# Patient Record
Sex: Male | Born: 1945 | Race: White | Hispanic: No | Marital: Married | Smoking: Never smoker
Health system: Southern US, Community
[De-identification: ages and names within clinical notes are randomized; demographics above are authoritative.]

## PROBLEM LIST (undated history)

## (undated) DIAGNOSIS — M109 Gout, unspecified: Secondary | ICD-10-CM

## (undated) DIAGNOSIS — I4892 Unspecified atrial flutter: Secondary | ICD-10-CM

## (undated) DIAGNOSIS — I10 Essential (primary) hypertension: Secondary | ICD-10-CM

## (undated) DIAGNOSIS — E7800 Pure hypercholesterolemia, unspecified: Secondary | ICD-10-CM

## (undated) HISTORY — DX: Unspecified atrial flutter: I48.92

## (undated) HISTORY — PX: PILONIDAL CYST EXCISION: SHX744

## (undated) HISTORY — PX: WISDOM TOOTH EXTRACTION: SHX21

## (undated) HISTORY — DX: Gout, unspecified: M10.9

## (undated) HISTORY — PX: TONSILLECTOMY: SHX5217

## (undated) HISTORY — PX: KIDNEY SURGERY: SHX687

---

## 1998-07-03 ENCOUNTER — Other Ambulatory Visit: Admission: RE | Admit: 1998-07-03 | Discharge: 1998-07-03 | Payer: Self-pay | Admitting: Cardiology

## 2003-09-12 ENCOUNTER — Emergency Department (HOSPITAL_COMMUNITY): Admission: EM | Admit: 2003-09-12 | Discharge: 2003-09-12 | Payer: Self-pay | Admitting: Emergency Medicine

## 2003-09-12 ENCOUNTER — Encounter: Payer: Self-pay | Admitting: Emergency Medicine

## 2007-01-29 ENCOUNTER — Encounter: Admission: RE | Admit: 2007-01-29 | Discharge: 2007-01-29 | Payer: Self-pay | Admitting: Otolaryngology

## 2016-06-29 ENCOUNTER — Emergency Department (HOSPITAL_COMMUNITY)
Admission: EM | Admit: 2016-06-29 | Discharge: 2016-06-29 | Disposition: A | Discharge status: Home / Self Care | Payer: BLUE CROSS/BLUE SHIELD | Attending: Emergency Medicine | Admitting: Emergency Medicine

## 2016-06-29 ENCOUNTER — Encounter (HOSPITAL_COMMUNITY): Payer: Self-pay | Admitting: *Deleted

## 2016-06-29 ENCOUNTER — Emergency Department (HOSPITAL_COMMUNITY): Payer: BLUE CROSS/BLUE SHIELD

## 2016-06-29 DIAGNOSIS — M25562 Pain in left knee: Secondary | ICD-10-CM

## 2016-06-29 DIAGNOSIS — M109 Gout, unspecified: Secondary | ICD-10-CM

## 2016-06-29 DIAGNOSIS — I10 Essential (primary) hypertension: Secondary | ICD-10-CM | POA: Insufficient documentation

## 2016-06-29 DIAGNOSIS — Z7982 Long term (current) use of aspirin: Secondary | ICD-10-CM | POA: Insufficient documentation

## 2016-06-29 DIAGNOSIS — M10062 Idiopathic gout, left knee: Secondary | ICD-10-CM | POA: Insufficient documentation

## 2016-06-29 HISTORY — DX: Pure hypercholesterolemia, unspecified: E78.00

## 2016-06-29 HISTORY — DX: Essential (primary) hypertension: I10

## 2016-06-29 LAB — BASIC METABOLIC PANEL
ANION GAP: 10 (ref 5–15)
BUN: 15 mg/dL (ref 6–20)
CHLORIDE: 100 mmol/L — AB (ref 101–111)
CO2: 24 mmol/L (ref 22–32)
Calcium: 9.3 mg/dL (ref 8.9–10.3)
Creatinine, Ser: 1.29 mg/dL — ABNORMAL HIGH (ref 0.61–1.24)
GFR calc non Af Amer: 55 mL/min — ABNORMAL LOW (ref >60)
Glucose, Bld: 116 mg/dL — ABNORMAL HIGH (ref 65–99)
Potassium: 4 mmol/L (ref 3.5–5.1)
Sodium: 134 mmol/L — ABNORMAL LOW (ref 135–145)

## 2016-06-29 LAB — CBC WITH DIFFERENTIAL/PLATELET
Basophils Absolute: 0 10*3/uL (ref 0.0–0.1)
Basophils Relative: 0 %
EOS PCT: 1 %
Eosinophils Absolute: 0.1 10*3/uL (ref 0.0–0.7)
HEMATOCRIT: 36.5 % — AB (ref 39.0–52.0)
HEMOGLOBIN: 12.5 g/dL — AB (ref 13.0–17.0)
LYMPHS PCT: 10 %
Lymphs Abs: 1.3 10*3/uL (ref 0.7–4.0)
MCH: 32.8 pg (ref 26.0–34.0)
MCHC: 34.2 g/dL (ref 30.0–36.0)
MCV: 95.8 fL (ref 78.0–100.0)
MONOS PCT: 5 %
Monocytes Absolute: 0.7 10*3/uL (ref 0.1–1.0)
NEUTROS PCT: 84 %
Neutro Abs: 10.9 10*3/uL — ABNORMAL HIGH (ref 1.7–7.7)
PLATELETS: 115 10*3/uL — AB (ref 150–400)
RBC: 3.81 MIL/uL — AB (ref 4.22–5.81)
RDW: 15.5 % (ref 11.5–15.5)
WBC: 13 10*3/uL — AB (ref 4.0–10.5)

## 2016-06-29 LAB — URIC ACID: URIC ACID, SERUM: 8.7 mg/dL — AB (ref 4.4–7.6)

## 2016-06-29 MED ORDER — HYDROCODONE-ACETAMINOPHEN 5-325 MG PO TABS: 1.0000 | ORAL_TABLET | Freq: Four times a day (QID) | ORAL | Status: DC | PRN

## 2016-06-29 MED ORDER — HYDROCODONE-ACETAMINOPHEN 5-325 MG PO TABS
2.0000 | ORAL_TABLET | Freq: Once | ORAL | Status: AC
  Administered 2016-06-29: 2 via ORAL
  Filled 2016-06-29: qty 2

## 2016-06-29 MED ORDER — PREDNISONE 20 MG PO TABS: 40.0000 mg | ORAL_TABLET | Freq: Every day | ORAL | Status: DC

## 2016-06-29 NOTE — ED Provider Notes (Signed)
CSN: [id number]     Arrival date & time 06/29/16  1503 History   First MD Initiated Contact with Patient 06/29/16 1713     Chief Complaint  Patient presents with  . Knee Pain  . Leg Pain     (Consider location/radiation/quality/duration/timing/severity/associated sxs/prior Treatment) HPI Comments: Patient presents to the ED with a chief complaint of left knee pain.  He reports a hx of gout.  States that his left knee has had steadily worsening pain for the past 3-4 days.  States that it started to worsen after discontinuing Meloxicam, which was stopped by his PCP due to renal impairment.  He denies any associated fevers or chills.  He states that the pain is worsened with movement and palpation and is better with rest.  He reports moderate associated swelling and has tried using a knee immobilizer with modest relief.  The history is provided by the patient. No language interpreter was used.    Past Medical History  Diagnosis Date  . Hypertension   . High cholesterol    History reviewed. No pertinent past surgical history. History reviewed. No pertinent family history. Social History  Substance Use Topics  . Smoking status: Never Smoker   . Smokeless tobacco: None  . Alcohol Use: Yes    Review of Systems  Constitutional: Negative for fever and chills.  Respiratory: Negative for shortness of breath.   Cardiovascular: Negative for chest pain.  Gastrointestinal: Negative for nausea, vomiting, diarrhea and constipation.  Genitourinary: Negative for dysuria.  Musculoskeletal: Positive for arthralgias.  All other systems reviewed and are negative.     Allergies  Sulfa antibiotics  Home Medications   Prior to Admission medications   Not on File   BP 157/60 mmHg  Pulse 71  Temp(Src) 98 F (36.7 C) (Oral)  Resp 18  SpO2 100% Physical Exam  Constitutional: He is oriented to person, place, and time. He appears well-developed and well-nourished.  HENT:  Head:  Normocephalic and atraumatic.  Eyes: Conjunctivae and EOM are normal. Pupils are equal, round, and reactive to light. Right eye exhibits no discharge. Left eye exhibits no discharge. No scleral icterus.  Neck: Normal range of motion. Neck supple. No JVD present.  Cardiovascular: Normal rate, regular rhythm and normal heart sounds.  Exam reveals no gallop and no friction rub.   No murmur heard. Pulmonary/Chest: Effort normal and breath sounds normal. No respiratory distress. He has no wheezes. He has no rales. He exhibits no tenderness.  Abdominal: Soft. He exhibits no distension and no mass. There is no tenderness. There is no rebound and no guarding.  Musculoskeletal: Normal range of motion. He exhibits no edema or tenderness.  Neurological: He is alert and oriented to person, place, and time.  Skin: Skin is warm and dry.  Psychiatric: He has a normal mood and affect. His behavior is normal. Judgment and thought content normal.  Nursing note and vitals reviewed.   ED Course  Procedures (including critical care time) Results for orders placed or performed during the hospital encounter of 06/29/16  CBC with Differential/Platelet  Result Value Ref Range   WBC 13.0 (H) 4.0 - 10.5 K/uL   RBC 3.81 (L) 4.22 - 5.81 MIL/uL   Hemoglobin 12.5 (L) 13.0 - 17.0 g/dL   HCT 08.636.5 (L) 57.839.0 - 46.952.0 %   MCV 95.8 78.0 - 100.0 fL   MCH 32.8 26.0 - 34.0 pg   MCHC 34.2 30.0 - 36.0 g/dL   RDW 62.915.5 52.811.5 - 41.315.5 %  Platelets 115 (L) 150 - 400 K/uL   Neutrophils Relative % 84 %   Lymphocytes Relative 10 %   Monocytes Relative 5 %   Eosinophils Relative 1 %   Basophils Relative 0 %   Neutro Abs 10.9 (H) 1.7 - 7.7 K/uL   Lymphs Abs 1.3 0.7 - 4.0 K/uL   Monocytes Absolute 0.7 0.1 - 1.0 K/uL   Eosinophils Absolute 0.1 0.0 - 0.7 K/uL   Basophils Absolute 0.0 0.0 - 0.1 K/uL   Smear Review MORPHOLOGY UNREMARKABLE   Basic metabolic panel  Result Value Ref Range   Sodium 134 (L) 135 - 145 mmol/L   Potassium 4.0  3.5 - 5.1 mmol/L   Chloride 100 (L) 101 - 111 mmol/L   CO2 24 22 - 32 mmol/L   Glucose, Bld 116 (H) 65 - 99 mg/dL   BUN 15 6 - 20 mg/dL   Creatinine, Ser 1.61 (H) 0.61 - 1.24 mg/dL   Calcium 9.3 8.9 - 09.6 mg/dL   GFR calc non Af Amer 55 (L) >60 mL/min   GFR calc Af Amer >60 >60 mL/min   Anion gap 10 5 - 15  Uric acid  Result Value Ref Range   Uric Acid, Serum 8.7 (H) 4.4 - 7.6 mg/dL   Dg Knee Complete 4 Views Left  06/29/2016  CLINICAL DATA:  Painful left knee, mostly in the kneecap region, which appears puffy and swollen; Pain began to worsen while on a beach trip one week ago; No specific trauma; EXAM: LEFT KNEE - COMPLETE 4+ VIEW COMPARISON:  None. FINDINGS: Joint effusion is present. There are mild degenerative changes of the patellofemoral joint. No acute fracture or subluxation. IMPRESSION: Joint effusion. Electronically Signed   By: Norva Pavlov M.D.   On: 06/29/2016 18:14    I have personally reviewed and evaluated these images and lab results as part of my medical decision-making.   MDM   Final diagnoses:  Gouty arthritis  Knee pain, acute, left    Patient with left knee pain. Worsening over the next week. Reports moderate swelling. Denies any fever. Pain is worsened with movement. Patient has a long history of gout.  Plain films reveal joint effusion, doubt septic joint, the patient is able to range the left knee. Uric acid is elevated. Patient is nontoxic. Vital signs are stable. Patient seen by and discussed with Dr. Estell Harpin, who agrees with plan for treatment for gout flare. Patient is stable and ready for discharge.    Roxy Horseman, PA-C 06/29/16 2000  Bethann Berkshire, MD 06/29/16 2250

## 2016-06-29 NOTE — ED Notes (Signed)
Ree Shay. Browning, PA,  at bedside.

## 2016-06-29 NOTE — ED Notes (Signed)
Pt reports ongoing joint pain and swelling to legs. [facility] and was told meloxicam could be causing symptoms. Was taken off it and started on new medications. Now having left knee and leg pain/swelling x 1 week. Denies injury.

## 2016-06-29 NOTE — Discharge Instructions (Signed)

## 2016-07-03 ENCOUNTER — Telehealth (HOSPITAL_BASED_OUTPATIENT_CLINIC_OR_DEPARTMENT_OTHER): Payer: Self-pay

## 2017-03-30 IMAGING — CR DG KNEE COMPLETE 4+V*L*
4 series · 4 of 4 positions shown · non-contrast
Comparison: None.

CLINICAL DATA: Painful left knee, mostly in the kneecap region,
which appears puffy and swollen; Pain began to worsen while on a
beach trip one week ago; No specific trauma;

EXAM:
LEFT KNEE - COMPLETE 4+ VIEW

[knee ap]
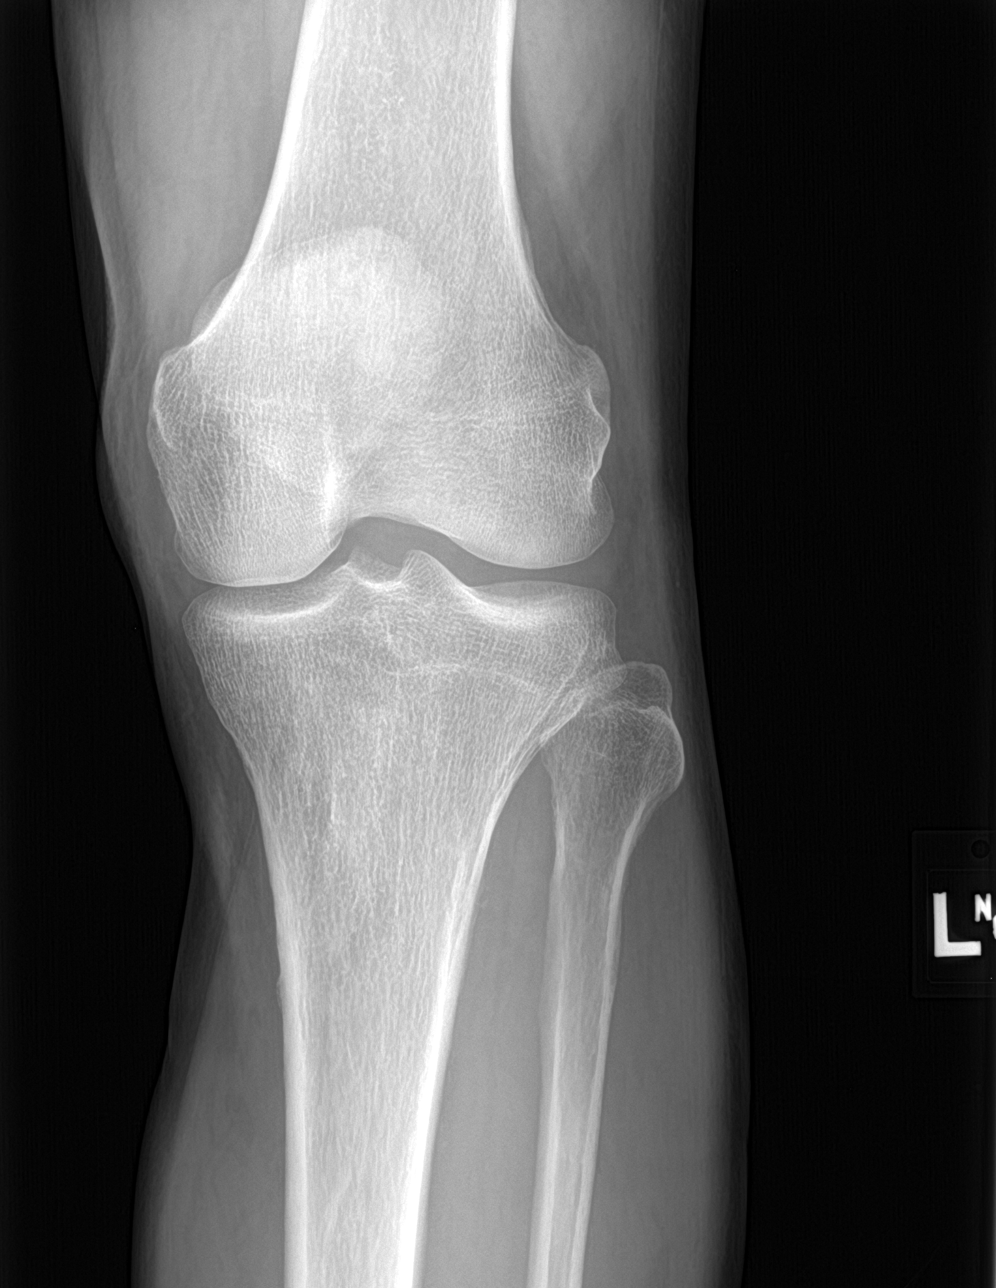

[knee lat]
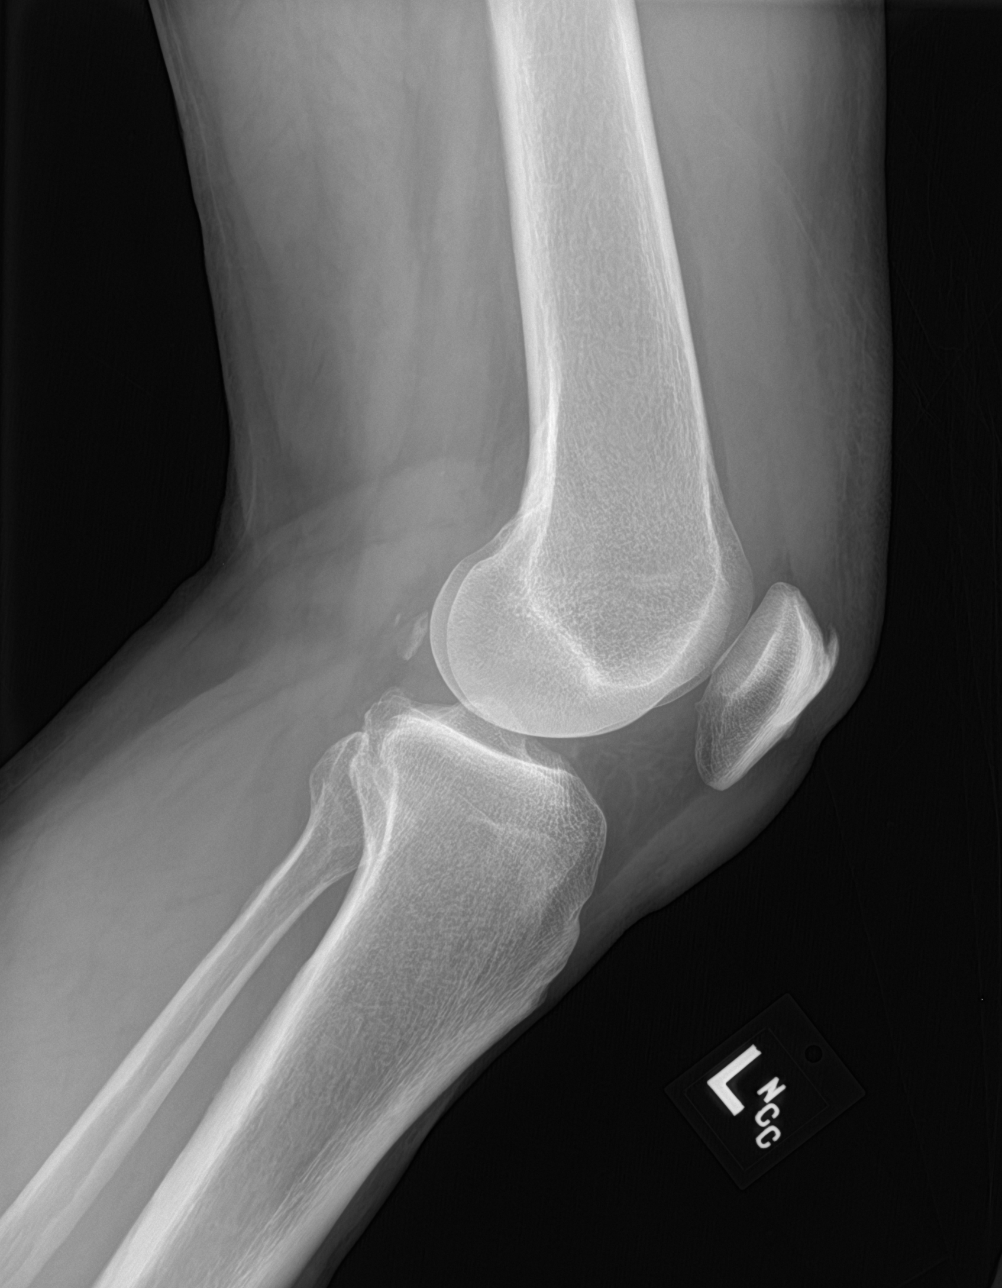

[knee obl (1 of 2)]
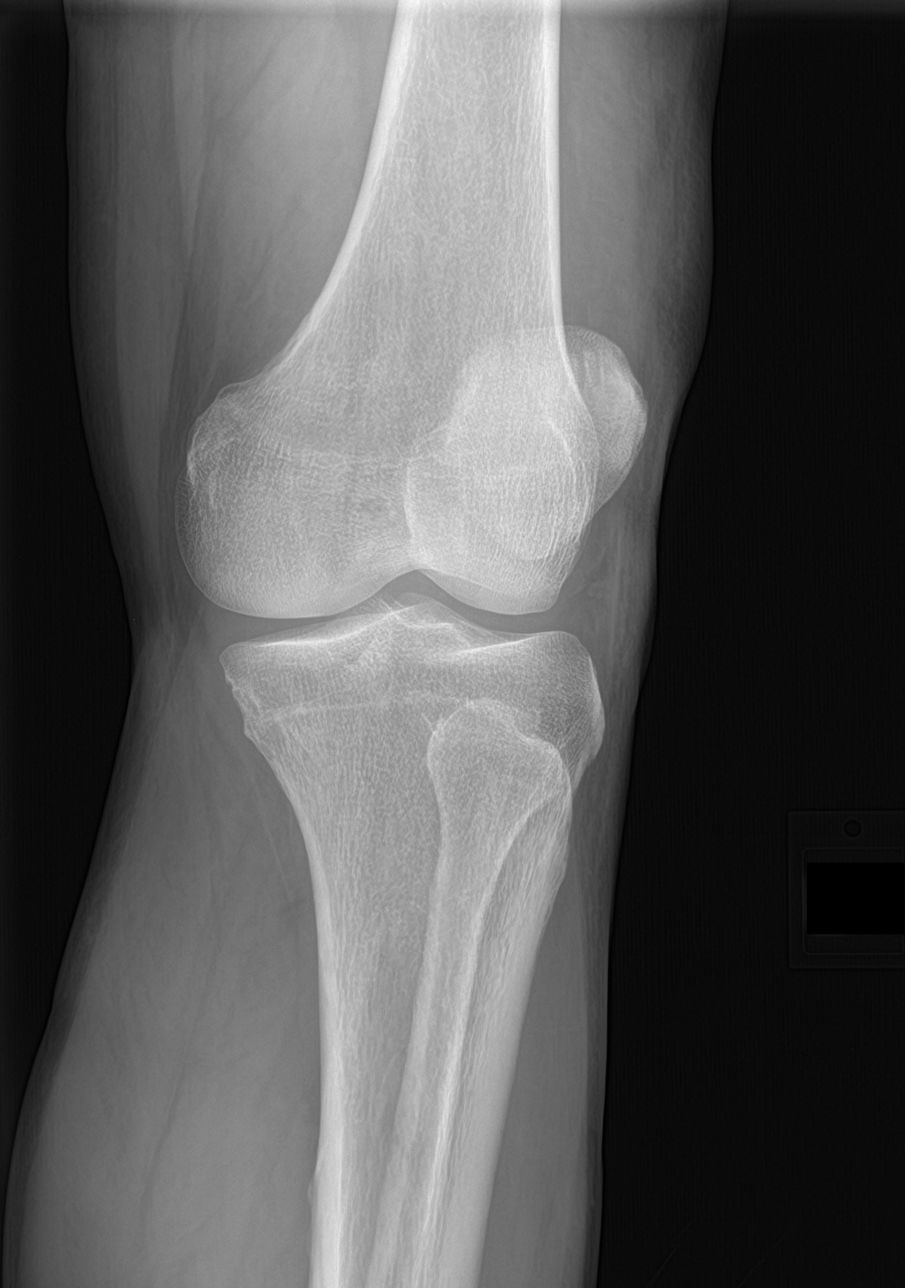

[knee obl (2 of 2)]
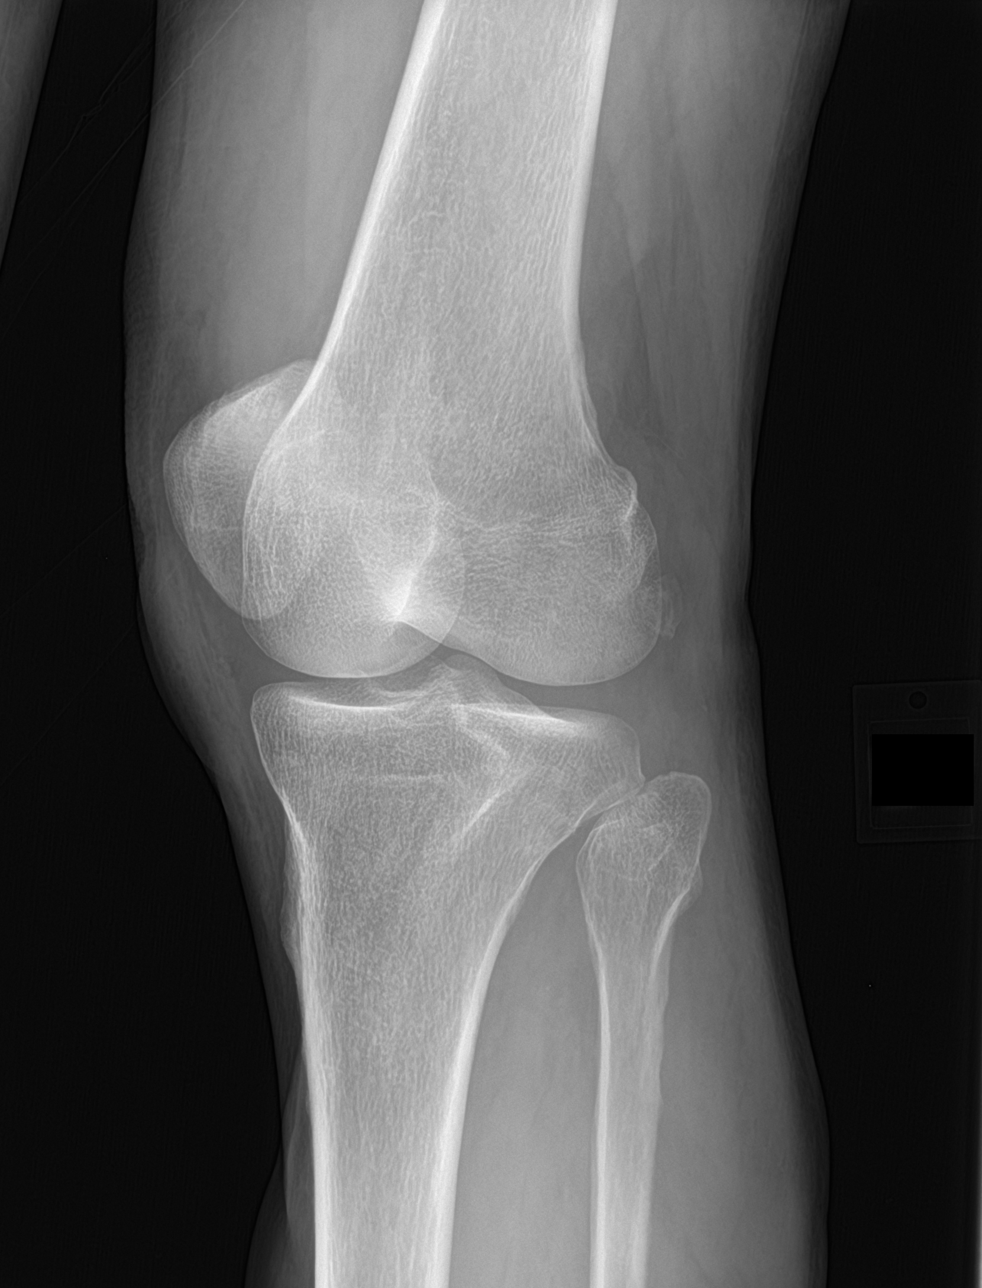

[4 of 4 positions shown; findings below may reference images not displayed]

FINDINGS: Joint effusion is present. There are mild degenerative changes of
the patellofemoral joint. No acute fracture or subluxation.
IMPRESSION: Joint effusion.

## 2024-05-28 ENCOUNTER — Encounter: Payer: Self-pay | Admitting: Gastroenterology

## 2024-07-11 ENCOUNTER — Other Ambulatory Visit: Payer: Self-pay

## 2024-07-11 ENCOUNTER — Telehealth (HOSPITAL_COMMUNITY): Payer: Self-pay | Admitting: Emergency Medicine

## 2024-07-11 ENCOUNTER — Emergency Department (HOSPITAL_COMMUNITY)

## 2024-07-11 ENCOUNTER — Encounter (HOSPITAL_COMMUNITY): Payer: Self-pay

## 2024-07-11 ENCOUNTER — Emergency Department (HOSPITAL_COMMUNITY)
Admission: EM | Admit: 2024-07-11 | Discharge: 2024-07-11 | Disposition: A | Discharge status: Home / Self Care | Attending: Emergency Medicine | Admitting: Emergency Medicine

## 2024-07-11 DIAGNOSIS — I4892 Unspecified atrial flutter: Secondary | ICD-10-CM | POA: Diagnosis not present

## 2024-07-11 DIAGNOSIS — R002 Palpitations: Secondary | ICD-10-CM | POA: Diagnosis present

## 2024-07-11 DIAGNOSIS — I10 Essential (primary) hypertension: Secondary | ICD-10-CM | POA: Insufficient documentation

## 2024-07-11 DIAGNOSIS — Z79899 Other long term (current) drug therapy: Secondary | ICD-10-CM | POA: Insufficient documentation

## 2024-07-11 DIAGNOSIS — D649 Anemia, unspecified: Secondary | ICD-10-CM | POA: Insufficient documentation

## 2024-07-11 LAB — COMPREHENSIVE METABOLIC PANEL WITH GFR
ALT: 14 U/L (ref 0–44)
AST: 20 U/L (ref 15–41)
Albumin: 4 g/dL (ref 3.5–5.0)
Alkaline Phosphatase: 76 U/L (ref 38–126)
Anion gap: 9 (ref 5–15)
BUN: 44 mg/dL — ABNORMAL HIGH (ref 8–23)
CO2: 18 mmol/L — ABNORMAL LOW (ref 22–32)
Calcium: 8.9 mg/dL (ref 8.9–10.3)
Chloride: 109 mmol/L (ref 98–111)
Creatinine, Ser: 1.53 mg/dL — ABNORMAL HIGH (ref 0.61–1.24)
GFR, Estimated: 47 mL/min — ABNORMAL LOW (ref >60)
Glucose, Bld: 151 mg/dL — ABNORMAL HIGH (ref 70–99)
Potassium: 4.7 mmol/L (ref 3.5–5.1)
Sodium: 136 mmol/L (ref 135–145)
Total Bilirubin: 0.6 mg/dL (ref 0.0–1.2)
Total Protein: 6.3 g/dL — ABNORMAL LOW (ref 6.5–8.1)

## 2024-07-11 LAB — CBC WITH DIFFERENTIAL/PLATELET
Abs Immature Granulocytes: 0.04 K/uL (ref 0.00–0.07)
Basophils Absolute: 0 K/uL (ref 0.0–0.1)
Basophils Relative: 0 %
Eosinophils Absolute: 0.2 K/uL (ref 0.0–0.5)
Eosinophils Relative: 2 %
HCT: 35.8 % — ABNORMAL LOW (ref 39.0–52.0)
Hemoglobin: 12.3 g/dL — ABNORMAL LOW (ref 13.0–17.0)
Immature Granulocytes: 0 %
Lymphocytes Relative: 8 %
Lymphs Abs: 0.8 K/uL (ref 0.7–4.0)
MCH: 34.6 pg — ABNORMAL HIGH (ref 26.0–34.0)
MCHC: 34.4 g/dL (ref 30.0–36.0)
MCV: 100.8 fL — ABNORMAL HIGH (ref 80.0–100.0)
Monocytes Absolute: 1 K/uL (ref 0.1–1.0)
Monocytes Relative: 10 %
Neutro Abs: 7.6 K/uL (ref 1.7–7.7)
Neutrophils Relative %: 80 %
Platelets: 104 K/uL — ABNORMAL LOW (ref 150–400)
RBC: 3.55 MIL/uL — ABNORMAL LOW (ref 4.22–5.81)
RDW: 14 % (ref 11.5–15.5)
WBC: 9.6 K/uL (ref 4.0–10.5)
nRBC: 0 % (ref 0.0–0.2)

## 2024-07-11 LAB — MAGNESIUM: Magnesium: 2 mg/dL (ref 1.7–2.4)

## 2024-07-11 LAB — TROPONIN I (HIGH SENSITIVITY)
Troponin I (High Sensitivity): 11 ng/L (ref <18)
Troponin I (High Sensitivity): 13 ng/L (ref <18)

## 2024-07-11 MED ORDER — SODIUM CHLORIDE 0.9 % IV BOLUS
1000.0000 mL | Freq: Once | INTRAVENOUS | Status: AC
  Administered 2024-07-11: 1000 mL via INTRAVENOUS

## 2024-07-11 MED ORDER — DILTIAZEM LOAD VIA INFUSION: 10.0000 mg | Freq: Once | INTRAVENOUS | Status: DC

## 2024-07-11 MED ORDER — DILTIAZEM HCL 25 MG/5ML IV SOLN
10.0000 mg | Freq: Once | INTRAVENOUS | Status: AC
  Administered 2024-07-11: 10 mg via INTRAVENOUS
  Filled 2024-07-11: qty 5

## 2024-07-11 MED ORDER — APIXABAN 5 MG PO TABS: 5.0000 mg | ORAL_TABLET | Freq: Two times a day (BID) | ORAL | 0 refills | Status: DC

## 2024-07-11 NOTE — Telephone Encounter (Signed)
 Eliquis  prescription until cardiology followup.

## 2024-07-11 NOTE — Discharge Instructions (Signed)
 You were seen in the ER today for your palpitations.  You were found to be in an abnormal heart rhythm called atrial flutter.  Fortunately within single dose of medication you converted to a normal cardiac rhythm.  After a few hours of observation and discussion with cardiology team here it was decided you are safe to go home.  You have been referred to our outpatient cardiology service and should follow-up within this week.  Return to the ER with any recurrent palpitations, if you pass out, if you have any shortness of breath, chest pain, or any other severe symptoms.

## 2024-07-11 NOTE — ED Provider Notes (Signed)
 [place] EMERGENCY DEPARTMENT AT [facility] Provider Note   CSN: [id number] Arrival date & time: 07/11/24  9776     Patient presents with: Tachycardia   Timothy Peterson is a 78 y.o. male with history of hypertension and hypercholesterolemia that follows primarily at the [facility] who presents today with concern for persistent palpitations since 10 PM.  Patient states he has been having intermittent episodes of palpitations over the last week ranging from a few minutes to few hours at a time but this evening and persisted with a heart rate in the 140s according to his home BP and pulse ox.  He called the triage nurse at the [facility] who directed him to call 911 and present to the emergency department.  He is not experienced any chest pain or shortness of breath throughout this week.  He is very active man stating that he worked approximately 3 hours in the heat in his car yesterday and tolerated it very well.  He is well-appearing.  No anticoagulation or history of dysrhythmia in the past.  Report from EMS that patient was in sinus tachycardia in the 140s, however review of the rhythm strip at the bedside by this provider with suspicion for a flutter.   HPI     Prior to Admission medications   Medication Sig Start Date End Date Taking? Authorizing Provider  allopurinol (ZYLOPRIM) 100 MG tablet Take 100 mg by mouth daily. 06/09/24  Yes [provider]  atorvastatin (LIPITOR) 40 MG tablet Take 40 mg by mouth at bedtime. 12/12/23  Yes [provider]  carboxymethylcellulose (REFRESH PLUS) 0.5 % SOLN Place 1 drop into both eyes 3 (three) times daily as needed. 05/21/24  Yes [provider]  lisinopril (PRINIVIL,ZESTRIL) 10 MG tablet Take 10 mg by mouth daily.   Yes [provider]  MAGnesium-Oxide 400 (240 Mg) MG tablet Take 400 mg by mouth daily. 06/15/24  Yes [provider]  Nutritional Supplements (ENSURE CLEAR) LIQD Take 1 Bottle by mouth daily.  12/30/23  Yes [provider]  psyllium (METAMUCIL) 58.6 % powder Take 1 packet by mouth in the morning and at bedtime.   Yes [provider]  tadalafil (CIALIS) 20 MG tablet Take 20 mg by mouth daily as needed for erectile dysfunction. 12/12/23  Yes [provider]  Vitamin D, Cholecalciferol, 25 MCG (1000 UT) TABS Take 1 tablet by mouth daily. 04/11/24  Yes [provider]    Allergies: Novocain [procaine], Sulfa antibiotics, and Vardenafil hcl    Review of Systems  Respiratory: Negative.    Cardiovascular:  Positive for palpitations. Negative for chest pain and leg swelling.    Updated Vital Signs BP (!) 138/45   Pulse (!) 52   Temp (!) 97.5 F (36.4 C) (Oral)   Resp 17   SpO2 99%   Physical Exam Vitals and nursing note reviewed.  Constitutional:      Appearance: He is not ill-appearing or toxic-appearing.  HENT:     Head: Normocephalic and atraumatic.     Mouth/Throat:     Mouth: Mucous membranes are moist.     Pharynx: No oropharyngeal exudate or posterior oropharyngeal erythema.  Eyes:     General:        Right eye: No discharge.        Left eye: No discharge.     Conjunctiva/sclera: Conjunctivae normal.  Cardiovascular:     Rate and Rhythm: Regular rhythm. Tachycardia present.     Pulses: Normal  pulses.          Posterior tibial pulses are 2+ on the right side and 2+ on the left side.     Heart sounds: Normal heart sounds. No murmur heard. Pulmonary:     Effort: Pulmonary effort is normal. No respiratory distress.     Breath sounds: Normal breath sounds. No wheezing or rales.  Abdominal:     General: Bowel sounds are normal. There is no distension.     Palpations: Abdomen is soft.     Tenderness: There is no abdominal tenderness. There is no guarding or rebound.  Musculoskeletal:        General: No deformity.     Cervical back: Neck supple.     Right lower leg: No edema.  Skin:    General: Skin is warm and dry.      Capillary Refill: Capillary refill takes less than 2 seconds.  Neurological:     General: No focal deficit present.     Mental Status: He is alert and oriented to person, place, and time. Mental status is at baseline.     GCS: GCS eye subscore is 4. GCS verbal subscore is 5. GCS motor subscore is 6.  Psychiatric:        Mood and Affect: Mood normal.     (all labs ordered are listed, but only abnormal results are displayed) Labs Reviewed  CBC WITH DIFFERENTIAL/PLATELET - Abnormal; Notable for the following components:      Result Value   RBC 3.55 (*)    Hemoglobin 12.3 (*)    HCT 35.8 (*)    MCV 100.8 (*)    MCH 34.6 (*)    Platelets 104 (*)    All other components within normal limits  COMPREHENSIVE METABOLIC PANEL WITH GFR - Abnormal; Notable for the following components:   CO2 18 (*)    Glucose, Bld 151 (*)    BUN 44 (*)    Creatinine, Ser 1.53 (*)    Total Protein 6.3 (*)    GFR, Estimated 47 (*)    All other components within normal limits  MAGNESIUM  TROPONIN I (HIGH SENSITIVITY)  TROPONIN I (HIGH SENSITIVITY)    EKG: EKG Interpretation Date/Time:  Sunday July 11 2024 03:14:19 EDT Ventricular Rate:  61 PR Interval:  47 QRS Duration:  116 QT Interval:  397 QTC Calculation: 400 R Axis:   68  Text Interpretation: Sinus rhythm Short PR interval Nonspecific intraventricular conduction delay now sinus Confirmed by Carita Senior [phone]) on 07/11/2024 3:37:40 AM  Radiology: [facility] Chest Portable 1 View Result Date: 07/11/2024 CLINICAL DATA:  Shortness of breath EXAM: PORTABLE CHEST 1 VIEW COMPARISON:  None Available. FINDINGS: The heart size and mediastinal contours are within normal limits. Both lungs are clear. The visualized skeletal structures are unremarkable. IMPRESSION: No active disease. Electronically Signed   By: Oneil Devonshire M.D.   On: 07/11/2024 02:47     .Critical Care  Performed by: Bobette Pleasant SAUNDERS, PA-C Authorized by: Bobette Pleasant SAUNDERS, PA-C    Critical care provider statement:    Critical care time (minutes):  45   Critical care was time spent personally by me on the following activities:  Development of treatment plan with patient or surrogate, discussions with consultants, evaluation of patient's response to treatment, examination of patient, obtaining history from patient or surrogate, ordering and performing treatments and interventions, ordering and review of laboratory studies, ordering and review of radiographic studies, pulse oximetry and re-evaluation of patient's condition  Medications Ordered in the ED  sodium chloride  0.9 % bolus 1,000 mL (0 mLs Intravenous Stopped 07/11/24 0419)  diltiazem  (CARDIZEM ) injection 10 mg (10 mg Intravenous Given 07/11/24 0253)    Clinical Course as of 07/11/24 0721  Sun Jul 11, 2024  0319 Patient responded well to diltiazem , HR now in the high 50s, sinus rhythm.  [RS]  0345 Consult to cardiology, Dr. Cesario.  Cardiology who recommends further observation in the ED.  Patient remains in sinus rhythm may be able to be discharged today. [RS]  [phone] Patient observed in the emergency department for a few more hours with maintenance of his sinus rhythm throughout his stay in the ED.  He remains asymptomatic this time.  Repeat consult to cardiologist Dr. Cesario who recommends discharge without anticoagulation and expedited Cardiologic follow-up in the outpatient setting.  I appreciate your collaboration in the care of this patient.  Patient is eager to be Discharged home and is amenable to plan for close outpatientFollow-up in our system.   [RS]    Clinical Course User Index [RS] Jamyla Ard, Pleasant SAUNDERS, PA-C                                 Medical Decision Making 78 year old male with primary concern of palpitations.  Tachycardic to the 130s at time of arrival, vitals otherwise reassuring.  Patient is a in a flutter on EKG at time of arrival to the ED but is very well-appearing.  Cardiopulmonary exam  as above with rapid rate but otherwise reassuring.  Abdominal exam is benign.  No lower extremity edema and perfusing the extremities well.    DDx includes limited to SVT, A-fib, a flutter, multifocal atrial tachycardia, electrolyte derangement, sepsis, thyroid anomaly.   Amount and/or Complexity of Data Reviewed Labs: ordered.    Details: CBC with anemia with hemoglobin of 12.3 at patient's baseline.  Radiology: ordered.    Details:  Chest x-ray negative for acute cardiopulmonary disease. ECG/medicine tests:     Details: Intake EKG with A flutter.    Risk Prescription drug management. Decision regarding hospitalization.   Patient converted to normal sinus rhythm and maintained sinus rhythm for 4 hours remaining in his ED stay.  Repeat consult cardiology as above, patient is safe to be discharged at this time from a Cardiologic standpoint.  No indication for anticoagulation for first time aflutter,  per cardiology Dr. Cesario.  Stat outpatient referral for cardiology placed.  Patient is well-appearing, asymptomatic, and eager to be discharged at this time.  Clinical concern for emergent underlying condition that would warrant further ED workup and patient management is low.  Strict return precautions were given.  Alireza  voiced understanding of his medical evaluation and treatment plan. Each of their questions answered to their expressed satisfaction.  Return precautions were given.  Patient is well-appearing, stable, and was discharged in good condition.  This chart was dictated using voice recognition software, Dragon. Despite the best efforts of this provider to proofread and correct errors, errors may still occur which can change documentation meaning.      Final diagnoses:  Atrial flutter by electrocardiogram [facility])    ED Discharge Orders          Ordered    Ambulatory referral to Cardiology       Comments: If you have not heard from the Cardiology office within the next 72 hours  please call [phone].   07/11/24 9297  Amb Referral to [facility]        07/11/24 0703               Bobette Pleasant JONELLE DEVONNA 07/11/24 9278    Carita Senior, MD 07/11/24 2148

## 2024-07-11 NOTE — ED Triage Notes (Signed)
 Patient is coming from home with sudden onset of tachycardia. Has had multiple episodes of the same throughout the week that have resolved on there own. [facility]. Patient is asymptomatic with no SHOB or CP. EMS VS 136 HR 148/90 BP 98% RA

## 2024-07-12 NOTE — Progress Notes (Unsigned)
 Timothy Peterson [id number] [birth year]   Chief Complaint: Diarrhea, GERD  Referring Provider: Dasie Perkins, PA Primary GI MD: Sampson  HPI: Timothy Peterson is a 78 y.o. male with past medical history of hypertension, hypercholesterolemia, gout, GERD, prediabetes, OA, H. Pylori (?), new onset a. flutter who presents today for a complaint of diarrhea.    Per referral note, patient has had intermittent loose stools and positive lactoferrin.  Having diarrhea within 30 minutes of eating/drinking.  Stool studies obtained by PCP, all negative except for lactoferrin.  Denied constant diarrhea, but would have a lot of fecal urgency, with a single accident.  Diarrhea/loose stool episodes occurring daily for about a month at that time (04/14/2024).  Reported similar incident 4 to 5 years ago for which he was given antibiotics and that cleared up.  Since having stool studies done, patient quit all sugars/sweets and lost 4 to 5 pounds, and cut back to 1 cup of coffee a day.  Increase water intake and started using nondairy creamer.  Some improvement. No illness, travel, or hospitalization.  Reported occasional acid reflux 1-2 times a month, usually after lying down with ingestion of spicy foods.  Has had annual FIT testing from 2016-2024, all negative. EGD/colonoscopy 09/2004, both normal. No family history of GI cancers/adenomas.  No family history of IBD or liver disease.  Per referral note plan was for colonoscopy for further evaluation of diarrhea positive lactoferrin, and EGD for history of GERD and to rule out Barrett's.  Labs 03/12/2024: Hemoglobin low at 13.2, hematocrit 37.7, MCV 98.2, platelet count 117, normal hepatic function panel, hemoglobin A1c 6.1, normal iron and ferritin  Positive lactoferrin 03/15/2024.  Negative ova and parasites, negative C. difficile, negative GI panel screen.  07/11/2024 Patient seen in the ED for 1 week of intermittent palpitations which had become  persistent.  Heart rate in the 140s according to home BP and pulse ox.  Denied any chest pain or shortness of breath.  He was tachycardic to the 130s at time of arrival, vitals otherwise reassuring.  In A. flutter on EKG at time of arrival to the ED but well-appearing.  Improved with Cardizem .  Converted to sinus rhythm.  Cardiology recommended against anticoagulation as rhythm was flutter and not fibrillation.  Stat outpatient referral to cardiology was placed.   Patient states he was planning on EGD and colonoscopy through the [facility], but says he was not going to be able to have procedure until September/October.  Was seeing his nutritionist through the [facility] who referred him here to see if he could get an expedited procedure.  States he has been having diarrhea since February.  History of similar episode back in 2021, states he was put on antibiotics for 2 to 3 weeks but is unsure what he was diagnosed with at that time.  He has been taking Metamucil which has helped with the diarrhea but he has to be careful about the dose that he takes because it has caused constipation occasionally.  Full dose of 1 tablespoon ended up being too much for him.  Bowel movement frequency varies, and can depend on when he eats.  States stools are loose/mushy or can be watery.  He has had some occasional lower abdominal soreness.  Denies nausea, vomiting, fever, chills.  Denies any rectal bleeding or melena.  He reports history of intermittent acid reflux which tends to occur if he eats spicy foods, or if he eats late at night and then lays down.  Symptoms  occurring less than once a week.  He has been seeing a nutritionist due to gradual weight loss of about 20 pounds over the course of 5 years.  He has been drinking Ensure as a dietary supplement, also taking vitamin D.  Reports he has had anemia for the last 10 years which seems to be worsening.  Has not seen hematology, but states hematology has been consulted and  recommended abdominal ultrasound to evaluate liver and spleen.  He was prescribed Eliquis  following his ED visit, states he was not aware of this and has not picked it up.  States he is very active and denies chest pain or shortness of breath with exertion, though did have shortness of breath while in a flutter a couple days ago.  He has noticed some fatigue recently.  He has been referred to [facility] cardiology but has not been contacted yet to schedule an appointment.  He plans to call later today, and [facility] through [facility]. There has been some confusion regarding where he is to receive care.  Previous GI Procedures/Imaging      Past Medical History:  Diagnosis Date   High cholesterol    Hypertension     History reviewed. No pertinent surgical history.  Current Outpatient Medications  Medication Sig Dispense Refill   allopurinol (ZYLOPRIM) 100 MG tablet Take 100 mg by mouth daily.     apixaban  (ELIQUIS ) 5 MG TABS tablet Take 1 tablet (5 mg total) by mouth 2 (two) times daily. 60 tablet 0   atorvastatin (LIPITOR) 40 MG tablet Take 40 mg by mouth at bedtime.     carboxymethylcellulose (REFRESH PLUS) 0.5 % SOLN Place 1 drop into both eyes 3 (three) times daily as needed.     lisinopril (PRINIVIL,ZESTRIL) 10 MG tablet Take 10 mg by mouth daily.     MAGnesium-Oxide 400 (240 Mg) MG tablet Take 400 mg by mouth daily.     Nutritional Supplements (ENSURE CLEAR) LIQD Take 1 Bottle by mouth daily.     psyllium (METAMUCIL) 58.6 % powder Take 1 packet by mouth in the morning and at bedtime.     tadalafil (CIALIS) 20 MG tablet Take 20 mg by mouth daily as needed for erectile dysfunction.     Vitamin D, Cholecalciferol, 25 MCG (1000 UT) TABS Take 1 tablet by mouth daily.     No current facility-administered medications for this visit.    Allergies as of 07/13/2024 - Review Complete 07/13/2024  Allergen Reaction  Noted   Novocain [procaine] Hypertension 06/29/2016   Sulfa antibiotics Other (See Comments) 06/29/2016   Vardenafil hcl Palpitations 05/24/2008    History reviewed. No pertinent family history.  Social History   Tobacco Use   Smoking status: Never  Substance Use Topics   Alcohol use: Yes   Drug use: No     Review of Systems:    Constitutional: Positive for unintentional weight loss (gradual loss over 5 years) and fatigue, no fever, chills, weakness Eyes: No change in vision Ears, Nose, Throat:  No change in hearing or congestion Skin: No rash or itching Cardiovascular: No chest pain   Respiratory: No SOB or cough Gastrointestinal: See HPI and otherwise negative Genitourinary: No dysuria or change in urinary frequency Neurological: No headache, dizziness or syncope Musculoskeletal: No new muscle or joint pain Hematologic: No bleeding or bruising    Physical Exam:  Vital signs: BP (!) 144/60 (  BP Location: Left Arm, Patient Position: Sitting, Cuff Size: Normal)   Pulse 61   Ht 5' 11 (1.803 m)   Wt 174 lb 2 oz (79 kg)   BMI 24.29 kg/m   Constitutional: Pleasant male in NAD, alert and cooperative Head:  Normocephalic and atraumatic.  Eyes: No scleral icterus.  Mouth: No oral lesions. Respiratory: Respirations even and unlabored. Lungs clear to auscultation bilaterally.  No wheezes, crackles, or rhonchi.  Cardiovascular:  Regular rate and rhythm. No murmurs. No peripheral edema. Gastrointestinal:  Soft, nondistended, mild tenderness to palpation of lower abdomen. No rebound or guarding. Normal bowel sounds. No appreciable masses or hepatomegaly. Rectal:  Not performed.  Neurologic:  Alert and oriented x4;  grossly normal neurologically.  Skin:   Dry and intact without significant lesions or rashes. Psychiatric: Oriented to person, place and time. Demonstrates good judgement and reason without abnormal affect or behaviors.   RELEVANT LABS AND IMAGING: CBC     Component Value Date/Time   WBC 9.6 07/11/2024 0237   RBC 3.55 (L) 07/11/2024 0237   HGB 12.3 (L) 07/11/2024 0237   HCT 35.8 (L) 07/11/2024 0237   PLT 104 (L) 07/11/2024 0237   MCV 100.8 (H) 07/11/2024 0237   MCH 34.6 (H) 07/11/2024 0237   MCHC 34.4 07/11/2024 0237   RDW 14.0 07/11/2024 0237   LYMPHSABS 0.8 07/11/2024 0237   MONOABS 1.0 07/11/2024 0237   EOSABS 0.2 07/11/2024 0237   BASOSABS 0.0 07/11/2024 0237    CMP     Component Value Date/Time   NA 136 07/11/2024 0237   K 4.7 07/11/2024 0237   CL 109 07/11/2024 0237   CO2 18 (L) 07/11/2024 0237   GLUCOSE 151 (H) 07/11/2024 0237   BUN 44 (H) 07/11/2024 0237   CREATININE 1.53 (H) 07/11/2024 0237   CALCIUM 8.9 07/11/2024 0237   PROT 6.3 (L) 07/11/2024 0237   ALBUMIN 4.0 07/11/2024 0237   AST 20 07/11/2024 0237   ALT 14 07/11/2024 0237   ALKPHOS 76 07/11/2024 0237   BILITOT 0.6 07/11/2024 0237   GFRNONAA 47 (L) 07/11/2024 0237   GFRAA >60 06/29/2016 1838     Assessment/Plan:   Change in bowel habits Diarrhea Lower abdominal pain Unintentional weight loss Fatigue Anemia GERD New onset atrial flutter  Patient seen today to discuss EGD/colonoscopy.  He has been having diarrhea since about February of this year.  Prior workup has included negative ova and parasites, negative GI pathogen panel, and negative C. difficile.  He did have a positive lactoferrin and colonoscopy was recommended. Has had some improvement on Metamucil. He also has history of GERD, and has had intermittent symptoms occurring less than weekly.  EGD was ordered to rule out Barrett's esophagus. Last EGD/colonoscopy 2005. Patient has had negative annual FIT testing from 2016-2024. He had originally planned to have these procedures through the [facility], but was not going to be able to have it done until around October, states that he was referred to us  to hopefully have an earlier evaluation.  Plans for endoscopic evaluation have been complicated by  patient's visit to the ED over the weekend where he was found to have new onset atrial flutter on EKG.  He did convert to normal sinus rhythm, and has regular rate and rhythm on exam today.  Stat cardiology referral has been placed but patient does not yet have an appointment scheduled.  Was prescribed Eliquis  but has not picked this up or started taking.  Patient reports he has had  gradual weight loss of about 20 pounds over the last 5 years and is following with a nutritionist.  Also reports chronic anemia going on for 10 years, which seems to be recently worsening.  He denies any evidence of GI bleeding including melena, rectal bleeding, hematemesis.  He has had some intermittent lower abdominal pain/soreness.  Has had fatigue as well.  Denies chest pain, had some shortness of breath while in the ED but otherwise no issues with this.  Case and plan discussed with Dr. Suzann in office today.  - Will order additional labs today: CBC, CMP, TSH, TTG, IgA, fecal calprotectin, iron/ferritin, B12, folate - Order CT A/P for further evaluation of abdominal pain, weight loss, diarrhea, and positive fecal lactoferrin - Continue Metamucil - Will hold off on scheduling any procedures at this time in light of new onset atrial flutter.  Patient will need cardiac evaluation and clearance for procedures.  He may be starting Eliquis  as well. - Follow up 4-6 weeks to revisit procedures - Will check back in a couple weeks regarding cardiology consult and any plans for further workup.   Camie Furbish, PA-C [facility] 07/13/2024, 1:01 PM  Patient Care Team: Clinic, [facility] Lien as PCP - General

## 2024-07-13 ENCOUNTER — Encounter: Payer: Self-pay | Admitting: Gastroenterology

## 2024-07-13 ENCOUNTER — Other Ambulatory Visit (INDEPENDENT_AMBULATORY_CARE_PROVIDER_SITE_OTHER): Payer: Self-pay

## 2024-07-13 ENCOUNTER — Ambulatory Visit (INDEPENDENT_AMBULATORY_CARE_PROVIDER_SITE_OTHER): Admitting: Gastroenterology

## 2024-07-13 VITALS — BP 144/60 | HR 61 | Ht 71.0 in | Wt 174.1 lb

## 2024-07-13 DIAGNOSIS — R634 Abnormal weight loss: Secondary | ICD-10-CM

## 2024-07-13 DIAGNOSIS — K219 Gastro-esophageal reflux disease without esophagitis: Secondary | ICD-10-CM

## 2024-07-13 DIAGNOSIS — D649 Anemia, unspecified: Secondary | ICD-10-CM

## 2024-07-13 DIAGNOSIS — R1030 Lower abdominal pain, unspecified: Secondary | ICD-10-CM

## 2024-07-13 DIAGNOSIS — R5383 Other fatigue: Secondary | ICD-10-CM

## 2024-07-13 DIAGNOSIS — R194 Change in bowel habit: Secondary | ICD-10-CM | POA: Diagnosis not present

## 2024-07-13 DIAGNOSIS — I4892 Unspecified atrial flutter: Secondary | ICD-10-CM

## 2024-07-13 DIAGNOSIS — R197 Diarrhea, unspecified: Secondary | ICD-10-CM

## 2024-07-13 LAB — CBC WITH DIFFERENTIAL/PLATELET
Basophils Absolute: 0 K/uL (ref 0.0–0.1)
Basophils Relative: 0.3 % (ref 0.0–3.0)
Eosinophils Absolute: 0.1 K/uL (ref 0.0–0.7)
Eosinophils Relative: 0.8 % (ref 0.0–5.0)
HCT: 37.5 % — ABNORMAL LOW (ref 39.0–52.0)
Hemoglobin: 12.9 g/dL — ABNORMAL LOW (ref 13.0–17.0)
Lymphocytes Relative: 6.2 % — ABNORMAL LOW (ref 12.0–46.0)
Lymphs Abs: 0.6 K/uL — ABNORMAL LOW (ref 0.7–4.0)
MCHC: 34.3 g/dL (ref 30.0–36.0)
MCV: 100.2 fl — ABNORMAL HIGH (ref 78.0–100.0)
Monocytes Absolute: 0.8 K/uL (ref 0.1–1.0)
Monocytes Relative: 8.1 % (ref 3.0–12.0)
Neutro Abs: 8.1 K/uL — ABNORMAL HIGH (ref 1.4–7.7)
Neutrophils Relative %: 84.6 % — ABNORMAL HIGH (ref 43.0–77.0)
Platelets: 115 K/uL — ABNORMAL LOW (ref 150.0–400.0)
RBC: 3.74 Mil/uL — ABNORMAL LOW (ref 4.22–5.81)
RDW: 14.6 % (ref 11.5–15.5)
WBC: 9.5 K/uL (ref 4.0–10.5)

## 2024-07-13 LAB — COMPREHENSIVE METABOLIC PANEL WITH GFR
ALT: 12 U/L (ref 0–53)
AST: 17 U/L (ref 0–37)
Albumin: 4.7 g/dL (ref 3.5–5.2)
Alkaline Phosphatase: 82 U/L (ref 39–117)
BUN: 29 mg/dL — ABNORMAL HIGH (ref 6–23)
CO2: 19 meq/L (ref 19–32)
Calcium: 9.3 mg/dL (ref 8.4–10.5)
Chloride: 110 meq/L (ref 96–112)
Creatinine, Ser: 1.25 mg/dL (ref 0.40–1.50)
GFR: 55.77 mL/min — ABNORMAL LOW (ref >60.00)
Glucose, Bld: 107 mg/dL — ABNORMAL HIGH (ref 70–99)
Potassium: 4.8 meq/L (ref 3.5–5.1)
Sodium: 138 meq/L (ref 135–145)
Total Bilirubin: 0.5 mg/dL (ref 0.2–1.2)
Total Protein: 6.7 g/dL (ref 6.0–8.3)

## 2024-07-13 LAB — TSH: TSH: 1.86 u[IU]/mL (ref 0.35–5.50)

## 2024-07-13 LAB — IBC + FERRITIN
Ferritin: 112.4 ng/mL (ref 22.0–322.0)
Iron: 46 ug/dL (ref 42–165)
Saturation Ratios: 13.4 % — ABNORMAL LOW (ref 20.0–50.0)
TIBC: 344.4 ug/dL (ref 250.0–450.0)
Transferrin: 246 mg/dL (ref 212.0–360.0)

## 2024-07-13 LAB — B12 AND FOLATE PANEL
Folate: >23.4 ng/mL (ref >5.9)
Vitamin B-12: 222 pg/mL (ref 211–911)

## 2024-07-13 NOTE — Patient Instructions (Signed)
 Your provider has requested that you go to the basement level for lab work before leaving today. Press B on the elevator. The lab is located at the first door on the left as you exit the elevator.   Continue Metamucil.  You have been scheduled for a CT scan of the abdomen and pelvis at [facility], 1st floor Radiology. You are scheduled on 07/23/24 at 8 am. You should arrive 15 minutes prior to your appointment time for registration.    Please follow the written instructions below on the day of your exam:   1) Do not eat anything after 4 am (4 hours prior to your test)    You may take any medications as prescribed with a small amount of water, if necessary. If you take any of the following medications: METFORMIN, GLUCOPHAGE, GLUCOVANCE, AVANDAMET, RIOMET, FORTAMET, ACTOPLUS MET, JANUMET, GLUMETZA or METAGLIP, you MAY be asked to HOLD this medication 48 hours AFTER the exam.   The purpose of you drinking the oral contrast is to aid in the visualization of your intestinal tract. The contrast solution may cause some diarrhea. Depending on your individual set of symptoms, you may also receive an intravenous injection of x-ray contrast/dye. Plan on being at [facility] for 45 minutes or longer, depending on the type of exam you are having performed.   If you have any questions regarding your exam or if you need to reschedule, you may call [facility] at [phone] between the hours of 8:00 am and 5:00 pm, Monday-Friday.    _______________________________________________________  If your blood pressure at your visit was 140/90 or greater, please contact your primary care physician to follow up on this.  _______________________________________________________  If you are age 70 or older, your body mass index should be between 23-30. Your Body mass index is 24.29 kg/m. If this is out of the aforementioned range listed, please consider follow up with your Primary Care  Provider.  If you are age 59 or younger, your body mass index should be between 19-25. Your Body mass index is 24.29 kg/m. If this is out of the aformentioned range listed, please consider follow up with your Primary Care Provider.   ________________________________________________________  The [facility] providers would like to encourage you to use MYCHART to communicate with providers for non-urgent requests or questions.  Due to long hold times on the telephone, sending your provider a message by [facility] may be a faster and more efficient way to get a response.  Please allow 48 business hours for a response.  Please remember that this is for non-urgent requests.  _______________________________________________________  [facility] is using a team-based approach to care.  Your team is made up of your doctor and two to three APPS. Our APPS (Nurse Practitioners and Physician Assistants) work with your physician to ensure care continuity for you. They are fully qualified to address your health concerns and develop a treatment plan. They communicate directly with your gastroenterologist to care for you. Seeing the Advanced Practice Practitioners on your physician's team can help you by facilitating care more promptly, often allowing for earlier appointments, access to diagnostic testing, procedures, and other specialty referrals.

## 2024-07-13 NOTE — Progress Notes (Signed)
 Attending Physician's Attestation   I have reviewed the chart.   I agree with the Advanced Practitioner's note, impression, and recommendations with any updates as below. With new onset flutter diagnosis and possible anticoagulation, unless patient having other significant symptoms or laboratory findings of iron deficiency, agree that we need to let things settle with cardiology first before an endoscopic evaluation is considered.   Aloha Finner, MD [facility] Advanced Endoscopy Office # [phone]

## 2024-07-14 ENCOUNTER — Other Ambulatory Visit

## 2024-07-14 LAB — TISSUE TRANSGLUTAMINASE, IGA: (tTG) Ab, IgA: <1 U/mL

## 2024-07-14 LAB — IGA: Immunoglobulin A: 141 mg/dL (ref 70–320)

## 2024-07-14 NOTE — Addendum Note (Signed)
 Addended by: ARLETTA CREDIT E on: 07/14/2024 01:01 PM   Modules accepted: Level of Service

## 2024-07-16 ENCOUNTER — Ambulatory Visit: Payer: Self-pay | Admitting: Gastroenterology

## 2024-07-16 LAB — CALPROTECTIN, FECAL: Calprotectin, Fecal: 269 ug/g — ABNORMAL HIGH (ref 0–120)

## 2024-07-23 ENCOUNTER — Ambulatory Visit (HOSPITAL_COMMUNITY)
Admission: RE | Admit: 2024-07-23 | Discharge: 2024-07-23 | Disposition: A | Discharge status: Home / Self Care | Source: Ambulatory Visit | Attending: Gastroenterology | Admitting: Gastroenterology

## 2024-07-23 DIAGNOSIS — D649 Anemia, unspecified: Secondary | ICD-10-CM | POA: Insufficient documentation

## 2024-07-23 DIAGNOSIS — R1030 Lower abdominal pain, unspecified: Secondary | ICD-10-CM | POA: Insufficient documentation

## 2024-07-23 DIAGNOSIS — R197 Diarrhea, unspecified: Secondary | ICD-10-CM | POA: Diagnosis present

## 2024-07-23 DIAGNOSIS — R634 Abnormal weight loss: Secondary | ICD-10-CM | POA: Diagnosis present

## 2024-07-23 DIAGNOSIS — R194 Change in bowel habit: Secondary | ICD-10-CM | POA: Insufficient documentation

## 2024-07-23 MED ORDER — IOHEXOL 300 MG/ML  SOLN
100.0000 mL | Freq: Once | INTRAMUSCULAR | Status: AC | PRN
  Administered 2024-07-23: 100 mL via INTRAVENOUS

## 2024-07-26 NOTE — Telephone Encounter (Signed)
 Inbound call from patient returning phone call. Requesting a call back. Please advise, thank you.

## 2024-07-29 LAB — LAB REPORT - SCANNED
Free T4: 1.07
TSH: 4.3 (ref 0.41–5.90)

## 2024-07-30 ENCOUNTER — Telehealth: Payer: Self-pay | Admitting: Gastroenterology

## 2024-07-30 NOTE — Telephone Encounter (Signed)
 Please let patient know that I have discussed with Dr. Wilhelmenia regarding his CT scan.  We would like to rule out infectious causes of the inflammation seen on his scan while we wait to be able to do a colonoscopy. If patient is agreeable, please order updated QuantiFERON TB gold, GI pathogen panel, and C. difficile PCR. Depending on findings, we could also consider giving him a course of antibiotic Flagyl. Thank you

## 2024-08-02 ENCOUNTER — Other Ambulatory Visit: Payer: Self-pay

## 2024-08-02 DIAGNOSIS — R197 Diarrhea, unspecified: Secondary | ICD-10-CM

## 2024-08-02 NOTE — Telephone Encounter (Signed)
 Lab orders placed. Pt informed via VM.

## 2024-08-03 ENCOUNTER — Other Ambulatory Visit (INDEPENDENT_AMBULATORY_CARE_PROVIDER_SITE_OTHER)

## 2024-08-03 DIAGNOSIS — R197 Diarrhea, unspecified: Secondary | ICD-10-CM

## 2024-08-04 ENCOUNTER — Other Ambulatory Visit

## 2024-08-05 ENCOUNTER — Ambulatory Visit: Payer: Self-pay | Admitting: Gastroenterology

## 2024-08-05 LAB — CLOSTRIDIUM DIFFICILE BY PCR: Toxigenic C. Difficile by PCR: NEGATIVE

## 2024-08-05 LAB — SPECIMEN STATUS REPORT

## 2024-08-06 LAB — GASTROINTESTINAL PATHOGEN PNL
CampyloBacter Group: NOT DETECTED
Norovirus GI/GII: NOT DETECTED
Rotavirus A: NOT DETECTED
Salmonella species: NOT DETECTED
Shiga Toxin 1: NOT DETECTED
Shiga Toxin 2: NOT DETECTED
Shigella Species: NOT DETECTED
Vibrio Group: NOT DETECTED
Yersinia enterocolitica: NOT DETECTED

## 2024-08-06 LAB — QUANTIFERON-TB GOLD PLUS
Mitogen-NIL: 8.17 [IU]/mL
NIL: 0.01 [IU]/mL
QuantiFERON-TB Gold Plus: NEGATIVE
TB1-NIL: 0 [IU]/mL
TB2-NIL: 0 [IU]/mL

## 2024-08-10 NOTE — Progress Notes (Signed)
 Left message on machine to call back

## 2024-08-10 NOTE — Telephone Encounter (Signed)
 Patient is returning your call and is requesting a call back. Please advise.

## 2024-08-11 ENCOUNTER — Other Ambulatory Visit: Payer: Self-pay

## 2024-08-11 MED ORDER — METRONIDAZOLE 250 MG PO TABS: 250.0000 mg | ORAL_TABLET | Freq: Three times a day (TID) | ORAL | 0 refills | Status: AC

## 2024-08-11 NOTE — Progress Notes (Signed)
 Left message on machine to call back

## 2024-08-18 ENCOUNTER — Telehealth: Payer: Self-pay | Admitting: Gastroenterology

## 2024-08-18 NOTE — Telephone Encounter (Signed)
 Inbound call from patient stating he's returning a call from [facility] and if he can be left a vm on what it is about if he does not get the chance to answer Please advise  Thank you

## 2024-08-18 NOTE — Telephone Encounter (Signed)
 Inbound call from patient stating he is returning phone call to [facility]. Please advise, thank you

## 2024-08-18 NOTE — Telephone Encounter (Signed)
 Left message on machine to call back

## 2024-08-19 NOTE — Telephone Encounter (Signed)
 The pt has been advised that he may have gotten an older message. Nothing needed at this time.

## 2024-08-19 NOTE — Telephone Encounter (Signed)
 Patient returning call Requesting a call back  Please advise  Thank you

## 2024-08-24 NOTE — Progress Notes (Unsigned)
 Timothy Peterson [id number] July 14, 1946   Chief Complaint:  Referring Provider: [facility] GI MD: Dr. Wilhelmenia  HPI: Timothy Peterson is a 78 y.o. male with past medical history of hypertension, hypercholesterolemia, gout, GERD, prediabetes, OA, H. Pylori (?), new onset a. flutter  who presents today for follow up.    Patient initially seen in office 07/13/2024 to discuss EGD/colonoscopy based on history of diarrhea and GERD which was being worked up initially by PCP.  At visit, patient endorsed having diarrhea since about February with prior workup including negative ova and parasites, negative GI pathogen panel, and negative C. difficile.  He did have a positive fecal lactoferrin and colonoscopy was recommended.  He had had some improvement on Metamucil.  Also endorsed history of GERD with intermittent symptoms occurring less than weekly. Also reported gradual weight loss of about 20 pounds over the last 5 years, following with a nutritionist.  Reported chronic anemia going on for 10 years with recent worsening.  Has had annual FIT testing from 2016-2024, all negative. EGD/colonoscopy 09/2004, both normal. No family history of GI cancers/adenomas.  No family history of IBD or liver disease.   07/11/2024 Patient seen in the ED for 1 week of intermittent palpitations which had become persistent.  Heart rate in the 140s according to home BP and pulse ox.  Denied any chest pain or shortness of breath.  He was tachycardic to the 130s at time of arrival, vitals otherwise reassuring.  In A. flutter on EKG at time of arrival to the ED but well-appearing.  Improved with Cardizem .  Converted to sinus rhythm.  Cardiology recommended against anticoagulation as rhythm was flutter and not fibrillation.  Stat outpatient referral to cardiology was placed.  Due to new onset atrial flutter, procedures were not scheduled at last visit.  Labs and CT scan were ordered as well as fecal  calprotectin.  Labs 07/13/2024: Stable macrocytic anemia with hemoglobin 12.9, hematocrit 37.5, MCV 100.2, normal WBC, low platelets 115, stable decreased kidney function, normal thyroid function, normal folate, normal iron/ferritin, low normal vitamin B12  Fecal calprotectin was elevated at 269.  CT A/P 07/23/2024 showed mild wall thickening and mucosal enhancement involving multiple distal small bowel loops in the right lower quadrant, with inflammatory changes in the right lower quadrant mesentery consistent with enteritis, and differential diagnosis includes Crohn's disease, infectious, or other inflammatory etiologies.  Additional recommendations from Dr. Wilhelmenia were to get a QuantiFERON TB Gold in addition to repeat GI pathogen panel and C. difficile, with consideration for empiric course of Flagyl  if negative findings.  QuantiFERON-TB gold was negative.  Negative C. difficile and GI pathogen panel.  He was started on course of Flagyl  250 mg 3 times daily for 10 days.  Looks like he is scheduled for an appointment with cardiology 09/10/2024.    Previous GI Procedures/Imaging   CT A/P 07/23/2024 Mild wall thickening and mucosal enhancement involving multiple distal small bowel loops in the right lower quadrant, with inflammatory changes in the right lower quadrant mesentery. This is consistent with enteritis, and differential diagnosis includes Crohn disease, infectious, or other inflammatory etiologies.   No evidence of bowel obstruction or abscess.   Colonic diverticulosis, without radiographic evidence of diverticulitis.   Past Medical History:  Diagnosis Date   High cholesterol    Hypertension     No past surgical history on file.  Current Outpatient Medications  Medication Sig Dispense Refill   allopurinol (ZYLOPRIM) 100 MG tablet Take 100  mg by mouth daily.     apixaban  (ELIQUIS ) 5 MG TABS tablet Take 1 tablet (5 mg total) by mouth 2 (two) times daily. 60 tablet 0    atorvastatin (LIPITOR) 40 MG tablet Take 40 mg by mouth at bedtime.     carboxymethylcellulose (REFRESH PLUS) 0.5 % SOLN Place 1 drop into both eyes 3 (three) times daily as needed.     lisinopril (PRINIVIL,ZESTRIL) 10 MG tablet Take 10 mg by mouth daily.     MAGnesium-Oxide 400 (240 Mg) MG tablet Take 400 mg by mouth daily.     Nutritional Supplements (ENSURE CLEAR) LIQD Take 1 Bottle by mouth daily.     psyllium (METAMUCIL) 58.6 % powder Take 1 packet by mouth in the morning and at bedtime.     tadalafil (CIALIS) 20 MG tablet Take 20 mg by mouth daily as needed for erectile dysfunction.     Vitamin D, Cholecalciferol, 25 MCG (1000 UT) TABS Take 1 tablet by mouth daily.     No current facility-administered medications for this visit.    Allergies as of 08/25/2024 - Reviewed 07/23/2024  Allergen Reaction Noted   Novocain [procaine] Hypertension 06/29/2016   Sulfa antibiotics Other (See Comments) 06/29/2016   Vardenafil hcl Palpitations 05/24/2008    No family history on file.  Social History   Tobacco Use   Smoking status: Never  Substance Use Topics   Alcohol use: Yes   Drug use: No     Review of Systems:    Constitutional: No weight loss, fever, chills, weakness or fatigue Eyes: No change in vision Ears, Nose, Throat:  No change in hearing or congestion Skin: No rash or itching Cardiovascular: No chest pain, chest pressure or palpitations   Respiratory: No SOB or cough Gastrointestinal: See HPI and otherwise negative Genitourinary: No dysuria or change in urinary frequency Neurological: No headache, dizziness or syncope Musculoskeletal: No new muscle or joint pain Hematologic: No bleeding or bruising    Physical Exam:  Vital signs: There were no vitals taken for this visit.  Constitutional: NAD, Well developed, Well nourished, alert and cooperative Head:  Normocephalic and atraumatic.  Eyes: No scleral icterus. Conjunctiva pink. Mouth: No oral  lesions. Respiratory: Respirations even and unlabored. Lungs clear to auscultation bilaterally.  No wheezes, crackles, or rhonchi.  Cardiovascular:  Regular rate and rhythm. No murmurs. No peripheral edema. Gastrointestinal:  Soft, nondistended, nontender. No rebound or guarding. Normal bowel sounds. No appreciable masses or hepatomegaly. Rectal:  Not performed.  Neurologic:  Alert and oriented x4;  grossly normal neurologically.  Skin:   Dry and intact without significant lesions or rashes. Psychiatric: Oriented to person, place and time. Demonstrates good judgement and reason without abnormal affect or behaviors.   RELEVANT LABS AND IMAGING: CBC    Component Value Date/Time   WBC 9.5 07/13/2024 1118   RBC 3.74 (L) 07/13/2024 1118   HGB 12.9 (L) 07/13/2024 1118   HCT 37.5 (L) 07/13/2024 1118   PLT 115.0 (L) 07/13/2024 1118   MCV 100.2 (H) 07/13/2024 1118   MCH 34.6 (H) 07/11/2024 0237   MCHC 34.3 07/13/2024 1118   RDW 14.6 07/13/2024 1118   LYMPHSABS 0.6 (L) 07/13/2024 1118   MONOABS 0.8 07/13/2024 1118   EOSABS 0.1 07/13/2024 1118   BASOSABS 0.0 07/13/2024 1118    CMP     Component Value Date/Time   NA 138 07/13/2024 1118   K 4.8 07/13/2024 1118   CL 110 07/13/2024 1118   CO2 19 07/13/2024 1118  GLUCOSE 107 (H) 07/13/2024 1118   BUN 29 (H) 07/13/2024 1118   CREATININE 1.25 07/13/2024 1118   CALCIUM 9.3 07/13/2024 1118   PROT 6.7 07/13/2024 1118   ALBUMIN 4.7 07/13/2024 1118   AST 17 07/13/2024 1118   ALT 12 07/13/2024 1118   ALKPHOS 82 07/13/2024 1118   BILITOT 0.5 07/13/2024 1118   GFRNONAA 47 (L) 07/11/2024 0237   GFRAA >60 06/29/2016 1838     Assessment/Plan:       Camie Furbish, PA-C [facility] 08/24/2024, 5:21 PM  Patient Care Team: [facility] as PCP - General

## 2024-08-25 ENCOUNTER — Ambulatory Visit: Admitting: Gastroenterology

## 2024-08-25 ENCOUNTER — Encounter: Payer: Self-pay | Admitting: Gastroenterology

## 2024-08-25 VITALS — BP 140/70 | HR 58 | Ht 71.0 in | Wt 174.0 lb

## 2024-08-25 DIAGNOSIS — R195 Other fecal abnormalities: Secondary | ICD-10-CM | POA: Diagnosis not present

## 2024-08-25 DIAGNOSIS — R634 Abnormal weight loss: Secondary | ICD-10-CM

## 2024-08-25 DIAGNOSIS — R1030 Lower abdominal pain, unspecified: Secondary | ICD-10-CM

## 2024-08-25 DIAGNOSIS — K529 Noninfective gastroenteritis and colitis, unspecified: Secondary | ICD-10-CM

## 2024-08-25 DIAGNOSIS — R197 Diarrhea, unspecified: Secondary | ICD-10-CM

## 2024-08-25 DIAGNOSIS — D649 Anemia, unspecified: Secondary | ICD-10-CM | POA: Diagnosis not present

## 2024-08-25 DIAGNOSIS — K219 Gastro-esophageal reflux disease without esophagitis: Secondary | ICD-10-CM | POA: Diagnosis not present

## 2024-08-25 DIAGNOSIS — I4892 Unspecified atrial flutter: Secondary | ICD-10-CM

## 2024-08-25 DIAGNOSIS — R194 Change in bowel habit: Secondary | ICD-10-CM

## 2024-08-25 DIAGNOSIS — R933 Abnormal findings on diagnostic imaging of other parts of digestive tract: Secondary | ICD-10-CM

## 2024-08-25 NOTE — Patient Instructions (Signed)
 Call if new or worsening symptoms  _______________________________________________________  If your blood pressure at your visit was 140/90 or greater, please contact your primary care physician to follow up on this.  _______________________________________________________  If you are age 77 or older, your body mass index should be between 23-30. Your Body mass index is 24.27 kg/m. If this is out of the aforementioned range listed, please consider follow up with your Primary Care Provider.  If you are age 67 or younger, your body mass index should be between 19-25. Your Body mass index is 24.27 kg/m. If this is out of the aformentioned range listed, please consider follow up with your Primary Care Provider.   ________________________________________________________  The [facility] providers would like to encourage you to use MYCHART to communicate with providers for non-urgent requests or questions.  Due to long hold times on the telephone, sending your provider a message by [facility] may be a faster and more efficient way to get a response.  Please allow 48 business hours for a response.  Please remember that this is for non-urgent requests.  _______________________________________________________  [facility] is using a team-based approach to care.  Your team is made up of your doctor and two to three APPS. Our APPS (Nurse Practitioners and Physician Assistants) work with your physician to ensure care continuity for you. They are fully qualified to address your health concerns and develop a treatment plan. They communicate directly with your gastroenterologist to care for you. Seeing the Advanced Practice Practitioners on your physician's team can help you by facilitating care more promptly, often allowing for earlier appointments, access to diagnostic testing, procedures, and other specialty referrals.    I appreciate the  opportunity to care for you  Thank You   Camie  Heinz,PA-C

## 2024-08-26 ENCOUNTER — Encounter: Payer: Self-pay | Admitting: Gastroenterology

## 2024-08-28 NOTE — Progress Notes (Signed)
 Attending Physician's Attestation   I have reviewed the chart.   I agree with the Advanced Practitioner's note, impression, and recommendations with any updates as below.    Corliss Parish, MD [facility] Advanced Endoscopy Office # [phone]

## 2024-09-06 NOTE — Progress Notes (Unsigned)
 Cardiology Office Note Date:  09/10/2024  ID:  Timothy Peterson, Berkowitz [birth year], MRN [id number] PCP:  [facility]  Cardiologist:  Joelle VEAR Ren Donley, MD  Chief Complaint  Patient presents with   Atrial Flutter      Problems Atrial Flutter Diagnosed 7/25 improved on dilt; no beta blockers 2/2 bradycardia No AC at this time TSH 4.3 8/25 HTN/HLD on AN40/LL10 Bradycardia 35-pack year tobacco use- quit 17 years ago  Visits  09/25: Eliquis , TTE, EM    History of Present Illness: Timothy Peterson is a 78 y.o. male who presents for pre-op and Aflutter.   Back in July, he started having chest palpitations and presented at the ED. He received IV diltiazem  and converted back to NSR. The ED recommended [facility] but never prescribed it. He denies any palpitations before or since then. He denies any CP or dyspnea with exertion. He denies orthopnea, PND, or LE edema. He does report some fatigue over the last couple of months but has been able to do his daily activities.  ROS: Please see the history of present illness. All other systems are reviewed and negative.   Past Medical History:  Diagnosis Date   High cholesterol    Hypertension     History reviewed. No pertinent surgical history.  Current Outpatient Medications  Medication Sig Dispense Refill   allopurinol (ZYLOPRIM) 100 MG tablet Take 100 mg by mouth daily.     atorvastatin (LIPITOR) 40 MG tablet Take 40 mg by mouth at bedtime.     carboxymethylcellulose (REFRESH PLUS) 0.5 % SOLN Place 1 drop into both eyes 3 (three) times daily as needed.     Cyanocobalamin (VITAMIN B12 PO) Take by mouth.     lisinopril (PRINIVIL,ZESTRIL) 10 MG tablet Take 10 mg by mouth daily.     MAGnesium-Oxide 400 (240 Mg) MG tablet Take 400 mg by mouth daily.     metroNIDAZOLE  (FLAGYL ) 250 MG tablet Take 250 mg by mouth 3 (three) times daily.     Nutritional Supplements (ENSURE CLEAR) LIQD Take 1 Bottle by mouth daily.     psyllium  (METAMUCIL) 58.6 % powder Take 1 packet by mouth in the morning and at bedtime.     tadalafil (CIALIS) 20 MG tablet Take 20 mg by mouth daily as needed for erectile dysfunction.     Vitamin D, Cholecalciferol, 25 MCG (1000 UT) TABS Take 1 tablet by mouth daily.     No current facility-administered medications for this visit.    Allergies:   Novocain [procaine], Sulfa antibiotics, and Vardenafil hcl   Social History:  35-pack year tobacco use- quit 17 years ago  Family History:  noncontributory  PHYSICAL EXAM: VS:  BP (!) 136/56   Pulse (!) 52   Ht 5' 11 (1.803 m)   Wt 177 lb 3.2 oz (80.4 kg)   SpO2 99%   BMI 24.71 kg/m  , BMI Body mass index is 24.71 kg/m. GEN: Well nourished, well developed, in no acute distress HEENT: normal Neck: no JVD, carotid bruits, or masses Cardiac: Bradycardic but regular rhythm; no murmurs, rubs, or gallops,no edema  Respiratory:  CTAB bilaterally, normal work of breathing GI: soft, nontender, nondistended, + BS Extremities: No LE edema Skin: warm and dry, no rash Neuro:  Strength and sensation are intact  EKG: Sinus bradycardia  Recent Labs: Reviewed  Studies: Reviewed  ASSESSMENT AND PLAN: Timothy Peterson is a 78 y.o. male who presents for pre-op and Aflutter.   #  Pre-op for EGD/CSY #New onset Atrial Flutter #HTN/HLD - Regarding pre-op, given no significant ischemic heart disease history, symptoms (able to do > 4 METs), and lower risk surger, no further evaluation is needed prior to CSY/EGD. Would recommend holding anticoagulation about 24-48 hrs prior to procedure. - Start Eliquis  5 mg BID for Aflutter and obtain TTE - Patient reports some fatigue over the last few months. I suspect that this could be due to his atrial flutter. Because of his bradycardia, we cannot pursue rate control strategy. I will do a 30-day event monitor to assess his atrial flutter burden. If low, it would be less likely to be contributing to his fatigue and we  will monitor. If high, we will consider rhythm control with ablation. - Will obtain lung Ca and AAA screening during next visit  Signed, Joelle VEAR Ren Donley, MD  09/10/2024 9:32 AM    [facility]

## 2024-09-10 ENCOUNTER — Ambulatory Visit

## 2024-09-10 VITALS — BP 136/56 | HR 52 | Ht 71.0 in | Wt 177.2 lb

## 2024-09-10 DIAGNOSIS — I4892 Unspecified atrial flutter: Secondary | ICD-10-CM | POA: Diagnosis not present

## 2024-09-10 DIAGNOSIS — E785 Hyperlipidemia, unspecified: Secondary | ICD-10-CM

## 2024-09-10 DIAGNOSIS — Z01818 Encounter for other preprocedural examination: Secondary | ICD-10-CM

## 2024-09-10 DIAGNOSIS — I10 Essential (primary) hypertension: Secondary | ICD-10-CM | POA: Diagnosis not present

## 2024-09-10 DIAGNOSIS — Z87891 Personal history of nicotine dependence: Secondary | ICD-10-CM

## 2024-09-10 MED ORDER — APIXABAN 5 MG PO TABS: 5.0000 mg | ORAL_TABLET | Freq: Two times a day (BID) | ORAL | 3 refills | Status: AC

## 2024-09-10 NOTE — Patient Instructions (Signed)
 Medication Instructions:  Please START taking eliquis  5 mg twice a day.   *If you need a refill on your cardiac medications before your next appointment, please call your pharmacy*  Lab Work: None.  If you have labs (blood work) drawn today and your tests are completely normal, you will receive your results only by: MyChart Message (if you have MyChart) OR A paper copy in the mail If you have any lab test that is abnormal or we need to change your treatment, we will call you to review the results.  Testing/Procedures: Your physician has requested that you have an echocardiogram. Echocardiography is a painless test that uses sound waves to create images of your heart. It provides your doctor with information about the size and shape of your heart and how well your heart's chambers and valves are working. This procedure takes approximately one hour. There are no restrictions for this procedure. Please do NOT wear cologne, perfume, aftershave, or lotions (deodorant is allowed). Please arrive 15 minutes prior to your appointment time.  Please note: We ask at that you not bring children with you during ultrasound (echo/ vascular) testing. Due to room size and safety concerns, children are not allowed in the ultrasound rooms during exams. Our front office staff cannot provide observation of children in our lobby area while testing is being conducted. An adult accompanying a patient to their appointment will only be allowed in the ultrasound room at the discretion of the ultrasound technician under special circumstances. We apologize for any inconvenience.  [employer] CARDIAC EVENT MONITOR INSTRUCTIONS Your physician has requested you wear a cardiac event monitor for _30_ days. [employer] may call or text to confirm a shipping address. The monitor will be sent to a land address via UPS. [employer] will not ship a monitor to a PO BOX. It typically takes 3-5 days to receive your monitor after it has been  enrolled. [employer] will assist with USPS tracking if your package is delayed. The telephone number for [employer] is   [phone]. Once you have received your monitor, please review the enclosed instructions. Instruction tutorials can also be viewed under help and settings on the enclosed cell phone. Your monitor has already been registered assigning a specific monitor serial # to you.  Billing and Self Pay Discount Information [employer] has been provided the insurance information we had on file for you.  If your insurance has been updated, please call [employer] at [phone] to provide them with your updated insurance information.   [employer] offers a discounted Self Pay option for patients who have insurance that does not cover their cardiac event monitor or patients without insurance.  The discounted cost of a Self Pay Cardiac Event Monitor would be $225.00 , if the patient contacts [employer] at [phone] within 7 days of applying the monitor to make payment arrangements.  If the patient does not contact [employer] within 7 days of applying the monitor, the cost of the cardiac event monitor will be $350.00.  Applying the monitor Remove cell phone from case and turn it on. The cell phone works as IT consultant and needs to be always within 10 feet of you. The cell phone will need to be charged daily. We recommend you plug the cell phone into the enclosed charger at your bedside table every night. Monitor batteries: You will receive two monitor batteries labelled #1 and #2. These are your recorders. Plug battery #2 onto the second connection on the enclosed charger. Always keep one battery on the  Consulting civil engineer. This will keep the monitor battery deactivated. It will also keep it fully charged for when you need to switch your monitor batteries. A small light will blink on the battery emblem when it is charging. The light on the battery emblem will remain on when the battery is fully  charged. Open package of a Monitor strip. Insert battery #1 into black hood on strip and gently squeeze monitor battery onto connection as indicated in instruction booklet. Set aside while preparing skin. Choose the location for your strip, vertical or horizontal, as indicated in the instruction booklet. Shave to remove all hair from location. There cannot be any lotions, oils, powders, or colognes on skin where monitor is to be applied. Wipe skin clean with enclosed Saline wipe. Dry skin completely. Peel paper labelled #1 off the back of the Monitor strip exposing the adhesive. Place the monitor on the chest in the vertical or horizontal position shown in the instruction booklet. One arrow on the monitor strip must be pointing upward. Carefully remove paper labelled #2, attaching remainder of strip to your skin. Try not to create any folds or wrinkles in the strip as you apply it. Firmly press and release the circle in the center of the monitor battery. You will hear a small beep. This is turning the monitor battery on. The heart emblem on the monitor battery will light up every 5 seconds if the monitor battery is turned on and connected to the patient securely. Do not push and hold the circle down as this turns the monitor battery off. The cell phone will locate the monitor battery. A screen will appear on the cell phone checking the connection of your monitor strip. This may read poor connection initially but change to good connection within the next minute. Once your monitor accepts the connection you will hear a series of 3 beeps followed by a climbing crescendo of beeps. A screen will appear on the cell phone showing the two monitor strip placement options. Touch the picture that demonstrates where you applied the monitor strip. Your monitor strip and battery are waterproof. You will be able to shower, bathe, or swim with the monitor on. Please do not submerge deeper than 3 feet underwater. We recommend  removing the monitor if you are swimming in a lake, river, or ocean. Your monitor battery will need to be switched to a fully charged monitor battery approximately once a week. The cell phone will alert you of an action which needs to be taken.  On the cell phone, tap for details to reveal connection status, monitor battery status, and cell phone battery status. A green dot will indicate your monitor is in good status, however, a red dot will indicate something that needs your attention. To record a symptom, click the circle on the monitor battery. In 30-60 seconds, a list of symptoms will appear on the cell phone. Select your symptoms and tap save. Your monitor will record a sustained or significant arrhythmia regardless of you clicking the button. Some patients do not feel the heart rhythm irregularities. [employer] will notify us  of any serious or critical events. Refer to instruction booklet for instructions on switching batteries, changing strips, the Do not disturb or Pause features, or any additional questions. Call [employer] at [phone], to confirm your monitor is transmitting and record your baseline. They will answer any questions you may have regarding the monitor instructions at that time. Returning the monitor to [employer] Place all equipment back into the blue box.  Peel off strip of paper to expose adhesive and close box securely. There is a prepaid UPS shipping label on this box. Drop in a UPS drop box, or at a UPS facility like [employer]. You may also contact [employer] to arrange UPS to pick up monitor package at your home.   Follow-Up: At [facility], you and your health needs are our priority.  As part of our continuing mission to provide you with exceptional heart care, our providers are all part of one team.  This team includes your primary Cardiologist (physician) and Advanced Practice Providers or APPs (Physician Assistants and Nurse Practitioners) who all work  together to provide you with the care you need, when you need it.  Your next appointment:   3 month(s)  Provider:   Dr. PHEBE Cedars, MD   We recommend signing up for the patient portal called MyChart.  Sign up information is provided on this After Visit Summary.  MyChart is used to connect with patients for Virtual Visits (Telemedicine).  Patients are able to view lab/test results, encounter notes, upcoming appointments, etc.  Non-urgent messages can be sent to your provider as well.   To learn more about what you can do with MyChart, go to [email or web address].

## 2024-10-04 ENCOUNTER — Telehealth: Payer: Self-pay

## 2024-10-04 NOTE — Telephone Encounter (Signed)
   Cardiac Monitor Alert  Date of alert:  10/04/2024   Patient Name: Timothy Peterson  DOB: [birth year]  MRN: [id number]   [facility] Cardiologist: None  [facility] EP:  None    Monitor Information: Cardiac Event Monitor [[employer]]  Reason:  Atrial Flutter Ordering provider:  Dr. Ren   Alert Atrial Fibrillation/Flutter This is the 1st alert for this rhythm.  The patient has a hx of Atrial Fibrillation/Flutter.    Anticoagulation medication as of 10/04/2024           apixaban  (ELIQUIS ) 5 MG TABS tablet Take 1 tablet (5 mg total) by mouth 2 (two) times daily.       Next Cardiology Appointment   Date:  12/03/24  Provider:  Dr. Azobou  The patient could NOT be reached by telephone today.  Left message for patient to call back.       Crixus Mcaulay Chauvigne, RN  10/04/2024 11:14 AM

## 2024-10-04 NOTE — Telephone Encounter (Signed)
 Spoke with the patient who states that he was aware of the episode. He states that he could feel his heart fluttering at the time but no other symptoms. This was around 1:00am yesterday. He reports that it finally started to settle down and he was able to get some sleep. He woke up and said that his heart was back in normal rhythm. He reports that he did feel fatigued yesterday. He reports that he is taking Eliquis  as prescribed. He states that today he is feeling fine. He reports heart rate on 51 this morning. He has had some elevated blood pressures the past couple of days with the following readings: 156/58, 181/62, 162/66. Will make Dr. Ren aware for any further recommendations.

## 2024-10-04 NOTE — Telephone Encounter (Signed)
 Abn result reading

## 2024-10-04 NOTE — Telephone Encounter (Signed)
 Received call from [place] and was advised that on 10/03/24 at 0119 patient initiated alert with no symptoms listed. Pt was in Atrial flutter at 136 bpm. Looks like this was addressed this morning. Please review earlier telephone encounter.

## 2024-10-15 ENCOUNTER — Encounter (HOSPITAL_COMMUNITY): Payer: Self-pay | Admitting: Radiology

## 2024-10-15 ENCOUNTER — Encounter (HOSPITAL_COMMUNITY): Payer: Self-pay

## 2024-10-15 ENCOUNTER — Emergency Department (HOSPITAL_COMMUNITY)
Admission: EM | Admit: 2024-10-15 | Discharge: 2024-10-16 | Disposition: A | Discharge status: Home / Self Care | Attending: Emergency Medicine | Admitting: Emergency Medicine

## 2024-10-15 ENCOUNTER — Other Ambulatory Visit: Payer: Self-pay

## 2024-10-15 ENCOUNTER — Ambulatory Visit (HOSPITAL_COMMUNITY)
Admission: RE | Admit: 2024-10-15 | Discharge: 2024-10-15 | Disposition: A | Discharge status: Home / Self Care | Source: Ambulatory Visit

## 2024-10-15 ENCOUNTER — Emergency Department (HOSPITAL_COMMUNITY)

## 2024-10-15 DIAGNOSIS — I4891 Unspecified atrial fibrillation: Secondary | ICD-10-CM | POA: Diagnosis not present

## 2024-10-15 DIAGNOSIS — I10 Essential (primary) hypertension: Secondary | ICD-10-CM | POA: Diagnosis present

## 2024-10-15 DIAGNOSIS — I4892 Unspecified atrial flutter: Secondary | ICD-10-CM

## 2024-10-15 DIAGNOSIS — Z7901 Long term (current) use of anticoagulants: Secondary | ICD-10-CM | POA: Insufficient documentation

## 2024-10-15 LAB — BASIC METABOLIC PANEL WITH GFR
Anion gap: 11 (ref 5–15)
BUN: 18 mg/dL (ref 8–23)
CO2: 24 mmol/L (ref 22–32)
Calcium: 9.1 mg/dL (ref 8.9–10.3)
Chloride: 103 mmol/L (ref 98–111)
Creatinine, Ser: 1.1 mg/dL (ref 0.61–1.24)
GFR, Estimated: >60 mL/min (ref >60)
Glucose, Bld: 109 mg/dL — ABNORMAL HIGH (ref 70–99)
Potassium: 4 mmol/L (ref 3.5–5.1)
Sodium: 138 mmol/L (ref 135–145)

## 2024-10-15 LAB — CBC
HCT: 38.1 % — ABNORMAL LOW (ref 39.0–52.0)
Hemoglobin: 12.8 g/dL — ABNORMAL LOW (ref 13.0–17.0)
MCH: 34.1 pg — ABNORMAL HIGH (ref 26.0–34.0)
MCHC: 33.6 g/dL (ref 30.0–36.0)
MCV: 101.6 fL — ABNORMAL HIGH (ref 80.0–100.0)
Platelets: 102 K/uL — ABNORMAL LOW (ref 150–400)
RBC: 3.75 MIL/uL — ABNORMAL LOW (ref 4.22–5.81)
RDW: 13.5 % (ref 11.5–15.5)
WBC: 8.5 K/uL (ref 4.0–10.5)
nRBC: 0 % (ref 0.0–0.2)

## 2024-10-15 LAB — ECHOCARDIOGRAM COMPLETE
Area-P 1/2: 3.72 cm2
S' Lateral: 3 cm

## 2024-10-15 LAB — TROPONIN I (HIGH SENSITIVITY)
Troponin I (High Sensitivity): 21 ng/L — ABNORMAL HIGH (ref <18)
Troponin I (High Sensitivity): 21 ng/L — ABNORMAL HIGH (ref <18)

## 2024-10-15 NOTE — ED Triage Notes (Signed)
 Denies Headache, c/o bilateral leg swelling. Denies SHOB.

## 2024-10-15 NOTE — Progress Notes (Signed)
 Patient arrived for his echocardiogram. Patient's blood pressure 209/87. During exam, patient complained of dizziness and lightheadedness. Patient advised to go to emergency room. Patient discharged with blood pressure of 200/87.

## 2024-10-15 NOTE — ED Triage Notes (Signed)
 Pt was being seen at the [facility] vascular center for an echo, but was sent here instead r/t HTN.   Last reading 207/84

## 2024-10-16 ENCOUNTER — Ambulatory Visit: Payer: Self-pay

## 2024-10-16 DIAGNOSIS — I483 Typical atrial flutter: Secondary | ICD-10-CM

## 2024-10-16 MED ORDER — LISINOPRIL 10 MG PO TABS
10.0000 mg | ORAL_TABLET | Freq: Once | ORAL | Status: AC
  Administered 2024-10-16: 10 mg via ORAL
  Filled 2024-10-16: qty 1

## 2024-10-16 MED ORDER — AMLODIPINE BESYLATE 5 MG PO TABS: 5.0000 mg | ORAL_TABLET | Freq: Every day | ORAL | 1 refills | Status: DC

## 2024-10-16 NOTE — ED Provider Notes (Signed)
 [place] EMERGENCY DEPARTMENT AT [facility] Provider Note   CSN: [id number] Arrival date & time: 10/15/24  1753     Patient presents with: Hypertension   Timothy Peterson is a 78 y.o. male.   78 year old male with a history of hypertension, hyperlipidemia, atrial fibrillation (on Eliquis ) presents to the emergency department at the advice of the heart and vascular ultrasound technicians.  Patient presented for a routine echocardiogram and had his vitals checked in the office.  His blood pressure was elevated with a systolic reading above 200.  He reports compliance with his lisinopril.  Has been checking his blood pressure at home routinely and has noted it to be higher than normal.  Reports that his systolic blood pressure usually runs in the 130s, but it has been running in the 160s to 170s recently.  He was able to complete a few hours of yard work this morning without any issue.  He has no complaints of headache, chest pain, shortness of breath, lightheadedness or dizziness, vision changes, syncope or near syncope.  He does experience mild bilateral lower extremity swelling at baseline.  The history is provided by the patient. No language interpreter was used.  Hypertension       Prior to Admission medications   Medication Sig Start Date End Date Taking? Authorizing Provider  amLODipine (NORVASC) 5 MG tablet Take 1 tablet (5 mg total) by mouth daily. 10/16/24  Yes Keith Sor, PA-C  allopurinol (ZYLOPRIM) 100 MG tablet Take 100 mg by mouth daily. 06/09/24   [provider]  apixaban  (ELIQUIS ) 5 MG TABS tablet Take 1 tablet (5 mg total) by mouth 2 (two) times daily. 09/10/24   Azobou Donley Joelle DEL, MD  atorvastatin (LIPITOR) 40 MG tablet Take 40 mg by mouth at bedtime. 12/12/23   [provider]  carboxymethylcellulose (REFRESH PLUS) 0.5 % SOLN Place 1 drop into both eyes 3 (three) times daily as needed. 05/21/24   [provider]  Cyanocobalamin  (VITAMIN B12 PO) Take by mouth.    [provider]  lisinopril (PRINIVIL,ZESTRIL) 10 MG tablet Take 10 mg by mouth daily.    [provider]  MAGnesium-Oxide 400 (240 Mg) MG tablet Take 400 mg by mouth daily. 06/15/24   [provider]  metroNIDAZOLE  (FLAGYL ) 250 MG tablet Take 250 mg by mouth 3 (three) times daily.    [provider]  Nutritional Supplements (ENSURE CLEAR) LIQD Take 1 Bottle by mouth daily. 12/30/23   [provider]  psyllium (METAMUCIL) 58.6 % powder Take 1 packet by mouth in the morning and at bedtime.    [provider]  tadalafil (CIALIS) 20 MG tablet Take 20 mg by mouth daily as needed for erectile dysfunction. 12/12/23   [provider]  Vitamin D, Cholecalciferol, 25 MCG (1000 UT) TABS Take 1 tablet by mouth daily. 04/11/24   [provider]    Allergies: Novocain [procaine], Sulfa antibiotics, and Vardenafil hcl    Review of Systems Ten systems reviewed and are negative for acute change, except as noted in the HPI.    Updated Vital Signs BP (!) 179/59   Pulse 65   Temp 98.1 F (36.7 C) (Oral)   Resp 18   Ht 5' 11 (1.803 m)   Wt 78.9 kg   SpO2 100%   BMI 24.27 kg/m   Physical Exam Vitals and nursing note reviewed.  Constitutional:      General: He is not in acute distress.  Appearance: He is well-developed. He is not diaphoretic.     Comments: Nontoxic appearing, pleasant.  HENT:     Head: Normocephalic and atraumatic.  Eyes:     General: No scleral icterus.    Conjunctiva/sclera: Conjunctivae normal.  Cardiovascular:     Rate and Rhythm: Normal rate and regular rhythm.     Pulses: Normal pulses.  Pulmonary:     Effort: Pulmonary effort is normal. No respiratory distress.     Comments: Respirations even and unlabored Musculoskeletal:        General: Normal range of motion.     Cervical back: Normal range of motion.     Comments: Trace edema BLE  Skin:    General: Skin is  warm and dry.     Coloration: Skin is not pale.     Findings: No erythema or rash.  Neurological:     Mental Status: He is alert and oriented to person, place, and time.     Coordination: Coordination normal.  Psychiatric:        Behavior: Behavior normal.     (all labs ordered are listed, but only abnormal results are displayed) Labs Reviewed  BASIC METABOLIC PANEL WITH GFR - Abnormal; Notable for the following components:      Result Value   Glucose, Bld 109 (*)    All other components within normal limits  CBC - Abnormal; Notable for the following components:   RBC 3.75 (*)    Hemoglobin 12.8 (*)    HCT 38.1 (*)    MCV 101.6 (*)    MCH 34.1 (*)    Platelets 102 (*)    All other components within normal limits  TROPONIN I (HIGH SENSITIVITY) - Abnormal; Notable for the following components:   Troponin I (High Sensitivity) 21 (*)    All other components within normal limits  TROPONIN I (HIGH SENSITIVITY) - Abnormal; Notable for the following components:   Troponin I (High Sensitivity) 21 (*)    All other components within normal limits    EKG: None  Radiology: DG Chest 2 View Result Date: 10/15/2024 EXAM: 2 VIEW(S) XRAY OF THE CHEST 10/15/2024 06:32:00 PM COMPARISON: 07/11/2024 CLINICAL HISTORY: [facility] SHOB FINDINGS: LUNGS AND PLEURA: No focal pulmonary opacity. No pulmonary edema. No pleural effusion. No pneumothorax. HEART AND MEDIASTINUM: No acute abnormality of the cardiac and mediastinal silhouettes. BONES AND SOFT TISSUES: No acute osseous abnormality. IMPRESSION: 1. No acute cardiopulmonary disease. Electronically signed by: Lynwood Seip MD 10/15/2024 06:39 PM EDT RP Workstation: [id number]   ECHOCARDIOGRAM COMPLETE Result Date: 10/15/2024    ECHOCARDIOGRAM REPORT   Patient Name:   [facility] Timothy Peterson Date of Exam: 10/15/2024 Medical Rec #:  [id number]         Height:       71.0 in Accession #:    [id number]        Weight:       177.2 lb Date of Birth:  01-03-1946          BSA:          2.003 m Patient Age:    78 years          BP:           207/84 mmHg Patient Gender: M                 HR:           62 bpm. Exam Location:  [address] Procedure: 2D Echo, Color Doppler, Cardiac Doppler  and 3D Echo (Both Spectral            and Color Flow Doppler were utilized during procedure). Indications:    Atrial Flutter I48.92  History:        Patient has no prior history of Echocardiogram examinations.                 Risk Factors:Hypertension.  Sonographer:    Augustin Seals RDCS Referring Phys: [id number] [facility] H AZOBOU TONLEU IMPRESSIONS  1. Left ventricular ejection fraction, by estimation, is 60 to 65%. Left ventricular ejection fraction by 3D volume is 59 %. The left ventricle has normal function. The left ventricle has no regional wall motion abnormalities. Left ventricular diastolic  parameters were normal.  2. Right ventricular systolic function is normal. The right ventricular size is normal. Tricuspid regurgitation signal is inadequate for assessing PA pressure.  3. The mitral valve is normal in structure. Trivial mitral valve regurgitation. No evidence of mitral stenosis.  4. The aortic valve is tricuspid. Aortic valve regurgitation is trivial. Aortic valve sclerosis is present, with no evidence of aortic valve stenosis.  5. The inferior vena cava is dilated in size with >50% respiratory variability, suggesting right atrial pressure of 8 mmHg. FINDINGS  Left Ventricle: Left ventricular ejection fraction, by estimation, is 60 to 65%. Left ventricular ejection fraction by 3D volume is 59 %. The left ventricle has normal function. The left ventricle has no regional wall motion abnormalities. The left ventricular internal cavity size was normal in size. There is no left ventricular hypertrophy. Left ventricular diastolic parameters were normal. Normal left ventricular filling pressure. Right Ventricle: The right ventricular size is normal. No increase in right ventricular wall  thickness. Right ventricular systolic function is normal. Tricuspid regurgitation signal is inadequate for assessing PA pressure. Left Atrium: Left atrial size was normal in size. Right Atrium: Right atrial size was normal in size. Pericardium: There is no evidence of pericardial effusion. Mitral Valve: The mitral valve is normal in structure. Trivial mitral valve regurgitation. No evidence of mitral valve stenosis. Tricuspid Valve: The tricuspid valve is normal in structure. Tricuspid valve regurgitation is trivial. No evidence of tricuspid stenosis. Aortic Valve: The aortic valve is tricuspid. Aortic valve regurgitation is trivial. Aortic valve sclerosis is present, with no evidence of aortic valve stenosis. Pulmonic Valve: The pulmonic valve was normal in structure. Pulmonic valve regurgitation is trivial. No evidence of pulmonic stenosis. Aorta: The aortic root is normal in size and structure. Venous: The inferior vena cava is dilated in size with greater than 50% respiratory variability, suggesting right atrial pressure of 8 mmHg. IAS/Shunts: No atrial level shunt detected by color flow Doppler. Additional Comments: 3D was performed not requiring image post processing on an independent workstation and was normal. Wilbert Bihari MD Electronically signed by Wilbert Bihari MD Signature Date/Time: 10/15/2024/5:06:02 PM    Final      Procedures   Medications Ordered in the ED  lisinopril (ZESTRIL) tablet 10 mg (10 mg Oral Given 10/16/24 0039)    Clinical Course as of 10/16/24 0127  Fri Oct 15, 2024  2317 I have reviewed the patient's echocardiogram from earlier today.  LVEF 60 to 65%.  No regional wall abnormalities or evidence of diastolic dysfunction. [KH]    Clinical Course User Index [KH] Keith Sor, PA-C  Medical Decision Making Amount and/or Complexity of Data Reviewed Labs: ordered. Radiology: ordered.  Risk Prescription drug management.   This patient  presents to the ED for concern of HTN, this involves an extensive number of treatment options, and is a complaint that carries with it a high risk of complications and morbidity.  The differential diagnosis includes essential HTN vs heart failure vs hypertensive urgency/emergency vs CVA vs PRES   Co morbidities that complicate the patient evaluation  HTN HLD Afib   Additional history obtained:  External records from outside source obtained and reviewed including echocardiogram from earlier today which is normal.   Lab Tests:  I Ordered, and personally interpreted labs.  The pertinent results include:  Troponin 21 > 21. Hgb 12.8.   Imaging Studies ordered:  I ordered imaging studies including CXR  I independently visualized and interpreted imaging which showed no acute cardiopulmonary abnormality I agree with the radiologist interpretation   Cardiac Monitoring:  The patient was maintained on a cardiac monitor.  I personally viewed and interpreted the cardiac monitored which showed an underlying rhythm of: NSR   Medicines ordered and prescription drug management:  I ordered medication including Lisinopril for HTN  Reevaluation of the patient after these medicines showed that the patient stayed the same I have reviewed the patients home medicines and have made adjustments as needed   Test Considered:  BNP - clinically does not appear fluid overloaded or with signs of heart failure   Problem List / ED Course:  Patient presenting for elevated blood pressure readings at outpatient echocardiogram.  Did arrive with systolic blood pressure of 209.  This has spontaneously been improving since arrival.  Most recent blood pressure 179/59. Patient completely asymptomatic with respect to his blood pressure. No chest pain, SOB, headache, lightheadedness, vision changes, dizziness.  Echocardiogram from today is reassuring. EKG without signs of acute ischemia. Troponin remains flat at  21. Reports full compliance with his lisinopril.  He has noted some persistent elevated blood pressure readings during routine checks at his home.  Will add additional blood pressure agent, though have had a lengthy discussion with patient about risks associated with hypotension.  He has been advised to continue checking his blood pressure regularly and to follow-up with his primary care doctor or cardiologist within the week.  Should his systolic blood pressure go below 140, he has been advised to discontinue amlodipine and resume his prior antihypertensive regimen.  Also discussed symptoms which would prompt return to the ED for repeat evaluation.   Reevaluation:  After the interventions noted above, I reevaluated the patient and found that they have :remained stable   Social Determinants of Health:  Lives independently   Dispostion:  After consideration of the diagnostic results and the patients response to treatment, I feel that the patent would benefit from addition of Norvasc 5mg  to existing dose of Lisinopril 10mg  daily. Patient to follow closely with cardiology and/or PCP. Return precautions discussed and provided. Patient discharged in stable condition with no unaddressed concerns.       Final diagnoses:  Essential hypertension    ED Discharge Orders          Ordered    amLODipine (NORVASC) 5 MG tablet  Daily        10/16/24 0047               Keith Sor, PA-C 10/16/24 0134    Carita Senior, MD 10/16/24 [phone]

## 2024-10-16 NOTE — Discharge Instructions (Signed)
 We have given you a prescription for amlodipine 5 mg to take once daily for additional blood pressure control.  Use this with your currently prescribed medications.  We recommend that you continue to take your blood pressure 2-3 times per day and keep a log of your blood pressure readings.  If your systolic (top number) is below 140 while on amlodipine AND lisinopril, discontinue amlodipine and resume taking Lisinopril only as dropping your blood pressure too low can be problematic and increase your risk of falls.   Have your blood pressure rechecked by your primary care doctor or cardiologist within the week. Return to the ED for new or concerning symptoms such as chest pain, shortness of breath, headache, vision changes, lightheadedness/dizziness, loss of consciousness.

## 2024-10-18 ENCOUNTER — Ambulatory Visit

## 2024-10-18 DIAGNOSIS — I4892 Unspecified atrial flutter: Secondary | ICD-10-CM

## 2024-10-26 DIAGNOSIS — I4892 Unspecified atrial flutter: Secondary | ICD-10-CM

## 2024-10-27 ENCOUNTER — Ambulatory Visit: Admitting: Gastroenterology

## 2024-10-27 ENCOUNTER — Encounter: Payer: Self-pay | Admitting: Gastroenterology

## 2024-10-27 VITALS — BP 150/64 | HR 60 | Ht 69.25 in | Wt 184.2 lb

## 2024-10-27 DIAGNOSIS — R933 Abnormal findings on diagnostic imaging of other parts of digestive tract: Secondary | ICD-10-CM | POA: Diagnosis not present

## 2024-10-27 DIAGNOSIS — K529 Noninfective gastroenteritis and colitis, unspecified: Secondary | ICD-10-CM

## 2024-10-27 DIAGNOSIS — R194 Change in bowel habit: Secondary | ICD-10-CM | POA: Diagnosis not present

## 2024-10-27 DIAGNOSIS — D696 Thrombocytopenia, unspecified: Secondary | ICD-10-CM

## 2024-10-27 DIAGNOSIS — Z7901 Long term (current) use of anticoagulants: Secondary | ICD-10-CM

## 2024-10-27 MED ORDER — NA SULFATE-K SULFATE-MG SULF 17.5-3.13-1.6 GM/177ML PO SOLN: 1.0000 | ORAL | 0 refills | Status: DC

## 2024-10-27 NOTE — Patient Instructions (Addendum)
 You have been scheduled for a CT scan of the abdomen and pelvis at [facility] .You are scheduled on 10/29/24 at 5:30 pm . You should arrive 2 hrs and 15 minutes prior to your appointment time at 3:15 pm  for registration and to drink oral contrast.  Please follow the written instructions below on the day of your exam:   1) Do not eat anything after 1:30 pm (4 hours prior to your test)    You may take any medications as prescribed with a small amount of water, if necessary. If you take any of the following medications: METFORMIN, GLUCOPHAGE, GLUCOVANCE, AVANDAMET, RIOMET, FORTAMET, ACTOPLUS MET, JANUMET, GLUMETZA or METAGLIP, you MAY be asked to HOLD this medication 48 hours AFTER the exam.   The purpose of you drinking the oral contrast is to aid in the visualization of your intestinal tract. The contrast solution may cause some diarrhea. Depending on your individual set of symptoms, you may also receive an intravenous injection of x-ray contrast/dye. Plan on being at [facility] for 45 minutes or longer, depending on the type of exam you are having performed.   If you have any questions regarding your exam or if you need to reschedule, you may call [facility] at [phone] between the hours of 8:00 am and 5:00 pm, Monday-Friday.   You have been scheduled for an endoscopy and colonoscopy. Please follow the written instructions given to you at your visit today.  If you use inhalers (even only as needed), please bring them with you on the day of your procedure.  DO NOT TAKE 7 DAYS PRIOR TO TEST- Trulicity (dulaglutide) Ozempic, Wegovy (semaglutide) Mounjaro, Zepbound (tirzepatide) Bydureon Bcise (exanatide extended release)  DO NOT TAKE 1 DAY PRIOR TO YOUR TEST Rybelsus (semaglutide) Adlyxin (lixisenatide) Victoza (liraglutide) Byetta (exanatide) ____________________________________________________________  Due to recent changes in healthcare laws, you may see the  results of your imaging and laboratory studies on MyChart before your provider has had a chance to review them.  We understand that in some cases there may be results that are confusing or concerning to you. Not all laboratory results come back in the same time frame and the provider may be waiting for multiple results in order to interpret others.  Please give us  48 hours in order for your provider to thoroughly review all the results before contacting the office for clarification of your results.   Thank you for choosing me and [facility].  Dr. Wilhelmenia

## 2024-10-27 NOTE — Progress Notes (Unsigned)
 GASTROENTEROLOGY OUTPATIENT CLINIC VISIT   Primary Care Provider Clinic, [facility] [address] [place] [address] [phone]  Referring Provider Clinic, [facility] [address] [place],  [place] [address] [phone]  Patient Profile: Timothy Peterson is a 78 y.o. male with a pmh significant for  The patient presents to the [facility] for an evaluation and management of problem(s) noted below:  Problem List No diagnosis found.  History of Present Illness    The patient does/does not take NSAIDs or BC/Goody Powder. Patient has/has not had an EGD. Patient has/has not had a Colonoscopy.  GI Review of Systems Positive as above Negative for  Pyrosis; Reflux; Regurgitation; Dysphagia; Odynophagia; Globus; Post-prandial cough; Nocturnal cough; Nasal regurgitation; Epigastric pain; Nausea; Vomiting; Hematemesis; Jaundice; Change in Appetite; Early satiety; Abdominal pain; Abdominal bloating; Eructation; Flatulence; Change in BM Frequency; Change in BM Consistency; Constipation; Diarrhea; Incontinence; Urgency; Tenesmus; Hematochezia; Melena  Review of Systems General: Denies fevers/chills/weight loss unintentionally Cardiovascular: Denies chest pain Pulmonary: Denies shortness of breath Gastroenterological: See HPI Genitourinary: Denies darkened urine Hematological: Denies easy bruising/bleeding Endocrine: Denies temperature intolerance Dermatological: Denies jaundice Psychological: Mood is stable  Medications Current Outpatient Medications  Medication Sig Dispense Refill   allopurinol (ZYLOPRIM) 100 MG tablet Take 100 mg by mouth daily.     amLODipine (NORVASC) 5 MG tablet Take 1 tablet (5 mg total) by mouth daily. 30 tablet 1   apixaban  (ELIQUIS ) 5 MG TABS tablet Take 1 tablet (5 mg total) by mouth 2 (two) times daily. 180 tablet 3   atorvastatin (LIPITOR) 40 MG tablet Take 40 mg by mouth  at bedtime.     carboxymethylcellulose (REFRESH PLUS) 0.5 % SOLN Place 1 drop into both eyes 3 (three) times daily as needed.     Cyanocobalamin (VITAMIN B12 PO) Take by mouth.     lisinopril (PRINIVIL,ZESTRIL) 10 MG tablet Take 10 mg by mouth daily.     MAGnesium-Oxide 400 (240 Mg) MG tablet Take 400 mg by mouth daily.     Nutritional Supplements (ENSURE CLEAR) LIQD Take 1 Bottle by mouth daily.     psyllium (METAMUCIL) 58.6 % powder Take 1 packet by mouth in the morning and at bedtime.     tadalafil (CIALIS) 20 MG tablet Take 20 mg by mouth daily as needed for erectile dysfunction.     Vitamin D, Cholecalciferol, 25 MCG (1000 UT) TABS Take 1 tablet by mouth daily.     No current facility-administered medications for this visit.    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   PILONIDAL CYST EXCISION     TONSILLECTOMY     age 5   WISDOM TOOTH EXTRACTION     Social History   Socioeconomic History   Marital status: Married    Spouse name: Not on file   Number of children: 1   Years of education: Not on file   Highest education level: Not on file  Occupational History   Occupation: retired  Tobacco Use   Smoking status: Never   Smokeless tobacco: Never  Vaping Use   Vaping status: Never Used  Substance and Sexual Activity   Alcohol use: Yes   Drug use: No   Sexual activity: Not on file  Other Topics Concern   Not on file  Social History Narrative   Not on file   Social Drivers of Health   Financial Resource Strain: Not on file  Food Insecurity: Not on file  Transportation Needs: Not on file   Physical Activity: Not on file  Stress: Not on file  Social Connections: Not on file  Intimate Partner Violence: Not on file   Family History  Problem Relation Age of Onset   Angina Mother    Clotting disorder Mother    Diabetes Father    Heart failure Father    Other Sister        kidney donor   Kidney disease Sister        ? cancer   Clotting disorder Sister    Heart disease Sister    Kidney disease Brother        kidney recipient   Heart failure Brother    Liver disease Brother        transplant recipient   Kidney disease Brother        transplant recipient   Heart attack Brother    Macular degeneration Brother    Diabetes Brother    Heart disease Maternal Grandmother    I have reviewed his medical, social, and family history in detail and updated the electronic medical record as necessary.    PHYSICAL EXAMINATION  BP (!) 150/64 (BP Location: Left Arm, Patient Position: Sitting, Cuff Size: Normal)   Pulse 60   Ht 5' 9.25 (1.759 m) Comment: height measured without shoes  Wt 184 lb 4 oz (83.6 kg)   BMI 27.01 kg/m  Wt Readings from Last 3 Encounters:  10/27/24 184 lb 4 oz (83.6 kg)  10/15/24 174 lb (78.9 kg)  09/10/24 177 lb 3.2 oz (80.4 kg)   GEN: NAD, appears stated age, doesn't appear chronically ill PSYCH: Cooperative, without pressured speech EYE: Conjunctivae pink, sclerae anicteric ENT: MMM CV: Nontachycardic RESP: No audible wheezing GI: NABS, soft, NT/ND, without rebound or guarding, no HSM appreciated GU: DRE shows MSK/EXT: No significant lower extremity edema SKIN: No jaundice, no spider angiomata NEURO:  Alert & Oriented x 3, no focal deficits, no evidence of asterixis   REVIEW OF DATA  I reviewed the following data at the time of this encounter:  GI Procedures and Studies  ***  Laboratory Studies  ***  Imaging Studies  ***   ASSESSMENT  Mr. Timothy Peterson is a 78 y.o. male.  The patient is seen today for evaluation and management  of:  No diagnosis found.  ***   PLAN  There are no diagnoses linked to this encounter.   No orders of the defined types were placed in this encounter.   New Prescriptions   No medications on file   Modified Medications   No medications on file    Planned Follow Up No follow-ups on file.   Total Time in Face-to-Face and in Coordination of Care for patient including independent/personal interpretation/review of prior testing, medical history, examination, medication adjustment, communicating results with the patient directly, and documentation within the EHR is ***.   Aloha Finner, MD [facility] Office # [phone]

## 2024-10-28 ENCOUNTER — Other Ambulatory Visit: Payer: Self-pay

## 2024-10-28 ENCOUNTER — Telehealth: Payer: Self-pay

## 2024-10-28 MED ORDER — PEG 3350-KCL-NA BICARB-NACL 420 G PO SOLR: 4000.0000 mL | Freq: Once | ORAL | 0 refills | Status: AC

## 2024-10-28 NOTE — Telephone Encounter (Signed)
 Request for surgical clearance:     Endoscopy Procedure  What type of surgery is being performed?     Colon/EGD  When is this surgery scheduled?     11/09/24  What type of clearance is required ?   Pharmacy  Are there any medications that need to be held prior to surgery and how long? Eliquis  x2 days prior to procedure  Practice name and name of physician performing surgery?      [facility]  What is your office phone and fax number?      Phone- [phone]  Fax- [phone]  Anesthesia type (None, local, MAC, general) ?       MAC  Please route your response to Blondie Barks, CMA

## 2024-10-28 NOTE — Telephone Encounter (Signed)
 Pt returned call to f/u on results please advise

## 2024-10-29 ENCOUNTER — Encounter: Payer: Self-pay | Admitting: Gastroenterology

## 2024-10-29 ENCOUNTER — Ambulatory Visit (HOSPITAL_BASED_OUTPATIENT_CLINIC_OR_DEPARTMENT_OTHER)
Admission: RE | Admit: 2024-10-29 | Discharge: 2024-10-29 | Disposition: A | Discharge status: Home / Self Care | Source: Ambulatory Visit | Attending: Gastroenterology | Admitting: Gastroenterology

## 2024-10-29 DIAGNOSIS — D696 Thrombocytopenia, unspecified: Secondary | ICD-10-CM | POA: Insufficient documentation

## 2024-10-29 DIAGNOSIS — R194 Change in bowel habit: Secondary | ICD-10-CM | POA: Diagnosis present

## 2024-10-29 DIAGNOSIS — R933 Abnormal findings on diagnostic imaging of other parts of digestive tract: Secondary | ICD-10-CM | POA: Insufficient documentation

## 2024-10-29 DIAGNOSIS — K529 Noninfective gastroenteritis and colitis, unspecified: Secondary | ICD-10-CM | POA: Diagnosis present

## 2024-10-29 DIAGNOSIS — K50012 Crohn's disease of small intestine with intestinal obstruction: Secondary | ICD-10-CM | POA: Insufficient documentation

## 2024-10-29 DIAGNOSIS — Z7901 Long term (current) use of anticoagulants: Secondary | ICD-10-CM | POA: Insufficient documentation

## 2024-10-29 MED ORDER — IOHEXOL 300 MG/ML  SOLN
100.0000 mL | Freq: Once | INTRAMUSCULAR | Status: AC | PRN
  Administered 2024-10-29: 100 mL via INTRAVENOUS

## 2024-10-29 NOTE — Telephone Encounter (Signed)
 Pharmacy, can you please commend on how long Eliquis  can be held for upcoming procedure?  Thank you!

## 2024-11-01 ENCOUNTER — Ambulatory Visit: Payer: Self-pay | Admitting: Gastroenterology

## 2024-11-01 NOTE — Telephone Encounter (Signed)
     Primary Cardiologist: None  Clinical pharmacist have reviewed patient's chart and provided the following recommendations for: Timothy Peterson   Patient has not had an Afib/aflutter ablation in the last 3 months, DCCV within the last 4 weeks or a watchman implanted in the last 45 days.    Per office protocol, patient can hold Eliquis  for 2 days prior to procedure.    I will route this recommendation to the requesting party via Epic fax function and remove from pre-op pool.  Timothy Peterson. Timothy Gabbard NP-C     11/01/2024, 11:00 AM [facility] [address] [place], [place] [address] Office [phone]

## 2024-11-01 NOTE — Telephone Encounter (Signed)
 Patient with diagnosis of aflutter on Eliquis  for anticoagulation.    Procedure: Colon/EGD  Date of procedure: 11/09/24   CHA2DS2-VASc Score = 3   This indicates a 3.2% annual risk of stroke. The patient's score is based upon: CHF History: 0 HTN History: 1 Diabetes History: 0 Stroke History: 0 Vascular Disease History: 0 Age Score: 2 Gender Score: 0      CrCl 66.5 ml/min Platelet count 102  Patient has not had an Afib/aflutter ablation in the last 3 months, DCCV within the last 4 weeks or a watchman implanted in the last 45 days   Per office protocol, patient can hold Eliquis  for 2 days prior to procedure.    **This guidance is not considered finalized until pre-operative APP has relayed final recommendations.**

## 2024-11-03 ENCOUNTER — Other Ambulatory Visit: Payer: Self-pay

## 2024-11-03 DIAGNOSIS — R195 Other fecal abnormalities: Secondary | ICD-10-CM

## 2024-11-03 DIAGNOSIS — K529 Noninfective gastroenteritis and colitis, unspecified: Secondary | ICD-10-CM

## 2024-11-03 DIAGNOSIS — R194 Change in bowel habit: Secondary | ICD-10-CM

## 2024-11-03 DIAGNOSIS — R197 Diarrhea, unspecified: Secondary | ICD-10-CM

## 2024-11-03 DIAGNOSIS — R933 Abnormal findings on diagnostic imaging of other parts of digestive tract: Secondary | ICD-10-CM

## 2024-11-03 NOTE — Telephone Encounter (Signed)
 Patient has been informed okay to hold Eliquis  for 2 days prior to his procedure. Patient voiced understanding.

## 2024-11-04 ENCOUNTER — Other Ambulatory Visit

## 2024-11-04 ENCOUNTER — Ambulatory Visit: Payer: Self-pay | Admitting: Gastroenterology

## 2024-11-04 DIAGNOSIS — R194 Change in bowel habit: Secondary | ICD-10-CM | POA: Diagnosis not present

## 2024-11-04 DIAGNOSIS — R197 Diarrhea, unspecified: Secondary | ICD-10-CM

## 2024-11-04 DIAGNOSIS — R933 Abnormal findings on diagnostic imaging of other parts of digestive tract: Secondary | ICD-10-CM | POA: Diagnosis not present

## 2024-11-04 DIAGNOSIS — R195 Other fecal abnormalities: Secondary | ICD-10-CM

## 2024-11-04 DIAGNOSIS — K529 Noninfective gastroenteritis and colitis, unspecified: Secondary | ICD-10-CM

## 2024-11-04 LAB — C-REACTIVE PROTEIN: CRP: <0.5 mg/dL (ref 0.5–20.0)

## 2024-11-04 LAB — SEDIMENTATION RATE: Sed Rate: 3 mm/h (ref 0–20)

## 2024-11-05 ENCOUNTER — Other Ambulatory Visit

## 2024-11-05 ENCOUNTER — Telehealth: Payer: Self-pay | Admitting: Gastroenterology

## 2024-11-05 DIAGNOSIS — K529 Noninfective gastroenteritis and colitis, unspecified: Secondary | ICD-10-CM

## 2024-11-05 DIAGNOSIS — R194 Change in bowel habit: Secondary | ICD-10-CM

## 2024-11-05 DIAGNOSIS — R197 Diarrhea, unspecified: Secondary | ICD-10-CM

## 2024-11-05 DIAGNOSIS — R933 Abnormal findings on diagnostic imaging of other parts of digestive tract: Secondary | ICD-10-CM

## 2024-11-05 DIAGNOSIS — R195 Other fecal abnormalities: Secondary | ICD-10-CM

## 2024-11-05 NOTE — Telephone Encounter (Signed)
 Returned call to patient. Informed him of results of labs. Patient advised that stool study results are still pending. Patient voiced understanding.

## 2024-11-05 NOTE — Telephone Encounter (Signed)
 Inbound call from patient stating he came in for labs yesterday and also dropped off stool sample before upcoming procedure 11/09/24. Requesting a call back  Please advise  Thank you

## 2024-11-09 ENCOUNTER — Encounter: Payer: Self-pay | Admitting: Gastroenterology

## 2024-11-09 ENCOUNTER — Ambulatory Visit: Admitting: Gastroenterology

## 2024-11-09 VITALS — BP 138/66 | HR 53 | Temp 97.3°F | Resp 16 | Ht 59.0 in | Wt 184.0 lb

## 2024-11-09 DIAGNOSIS — K624 Stenosis of anus and rectum: Secondary | ICD-10-CM

## 2024-11-09 DIAGNOSIS — R933 Abnormal findings on diagnostic imaging of other parts of digestive tract: Secondary | ICD-10-CM

## 2024-11-09 DIAGNOSIS — D175 Benign lipomatous neoplasm of intra-abdominal organs: Secondary | ICD-10-CM

## 2024-11-09 DIAGNOSIS — K2970 Gastritis, unspecified, without bleeding: Secondary | ICD-10-CM

## 2024-11-09 DIAGNOSIS — K449 Diaphragmatic hernia without obstruction or gangrene: Secondary | ICD-10-CM

## 2024-11-09 DIAGNOSIS — K229 Disease of esophagus, unspecified: Secondary | ICD-10-CM

## 2024-11-09 MED ORDER — PANTOPRAZOLE SODIUM 20 MG PO TBEC: 20.0000 mg | DELAYED_RELEASE_TABLET | Freq: Every day | ORAL | 6 refills | Status: AC

## 2024-11-09 MED ORDER — SODIUM CHLORIDE 0.9 % IV SOLN: 500.0000 mL | Freq: Once | INTRAVENOUS | Status: DC

## 2024-11-09 NOTE — Op Note (Signed)
 [facility] Patient Name: Timothy Peterson Procedure Date: 11/09/2024 11:02 AM MRN: [id number] Endoscopist: Aloha Finner , MD, [id number] Age: 78 Referring MD:  Date of Birth: September 21, 1946 Gender: Male Account #: [id number] Procedure:                Upper GI endoscopy Indications:              Diarrhea Medicines:                Monitored Anesthesia Care Procedure:                Pre-Anesthesia Assessment:                           - Prior to the procedure, a History and Physical                            was performed, and patient medications and                            allergies were reviewed. The patient's tolerance of                            previous anesthesia was also reviewed. The risks                            and benefits of the procedure and the sedation                            options and risks were discussed with the patient.                            All questions were answered, and informed consent                            was obtained. Prior Anticoagulants: The patient has                            taken Eliquis  (apixaban ), last dose was 2 days                            prior to procedure. ASA Grade Assessment: III - A                            patient with severe systemic disease. After                            reviewing the risks and benefits, the patient was                            deemed in satisfactory condition to undergo the                            procedure.  After obtaining informed consent, the endoscope was                            passed under direct vision. Throughout the                            procedure, the patient's blood pressure, pulse, and                            oxygen saturations were monitored continuously. The                            Olympus Scope [id number] was introduced through the                            mouth, and advanced to the second part of duodenum.                             The upper GI endoscopy was accomplished without                            difficulty. The patient tolerated the procedure. Scope In: Scope Out: Findings:                 No gross lesions were noted in the entire esophagus.                           The Z-line was irregular and was found 41 cm from                            the incisors.                           A 1 cm hiatal hernia was present.                           Multiple dispersed small erosions with no bleeding                            and no stigmata of recent bleeding were found in                            the entire examined stomach. Biopsies were taken                            with a cold forceps for histology and Helicobacter                            pylori testing.                           No gross lesions were noted in the duodenal bulb,  in the first portion of the duodenum and in the                            second portion of the duodenum. Biopsies were taken                            with a cold forceps for histology. Complications:            No immediate complications. Estimated Blood Loss:     Estimated blood loss was minimal. Impression:               - No gross lesions in the entire esophagus. Z-line                            irregular, 41 cm from the incisors.                           - 1 cm hiatal hernia.                           - Erosive gastropathy with no bleeding and no                            stigmata of recent bleeding. Biopsied.                           - No gross lesions in the duodenal bulb, in the                            first portion of the duodenum and in the second                            portion of the duodenum. Biopsied. Recommendation:           - Proceed to scheduled colonoscopy.                           - Start Pantoprazole  20 mg daily.                           - Await pathology results.                           - The  findings and recommendations were discussed                            with the patient.                           - The findings and recommendations were discussed                            with the patient's family. Aloha Finner, MD 11/09/2024 11:13:28 AM

## 2024-11-09 NOTE — Progress Notes (Signed)
 GASTROENTEROLOGY PROCEDURE H&P NOTE   Primary Care Physician: [facility]  HPI: Timothy Peterson is a 78 y.o. 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     amLODipine  (NORVASC ) 5 MG tablet Take 1 tablet (5 mg total) by mouth daily. 30 tablet 1   apixaban  (ELIQUIS ) 5 MG TABS tablet Take 1 tablet (5 mg total) by mouth 2 (two) times daily. 180 tablet 3   atorvastatin (LIPITOR) 40 MG tablet Take 40 mg by mouth at bedtime.     carboxymethylcellulose (REFRESH PLUS) 0.5 % SOLN Place 1 drop into both eyes 3 (three) times daily as needed.     Cyanocobalamin  (VITAMIN B12 PO) Take by mouth.     lisinopril  (PRINIVIL ,ZESTRIL ) 10 MG tablet Take 10 mg by mouth daily.     MAGnesium-Oxide 400 (240 Mg) MG tablet Take 400 mg by mouth daily.     Nutritional Supplements (ENSURE CLEAR) LIQD Take 1 Bottle by mouth daily.     psyllium (METAMUCIL) 58.6 % powder Take 1 packet by mouth in the morning and at bedtime.     tadalafil (CIALIS) 20 MG tablet Take 20 mg by mouth daily as needed for erectile dysfunction.     Vitamin D, Cholecalciferol, 25 MCG (1000 UT) TABS Take 1 tablet by mouth daily.     No current facility-administered medications for this visit.    Current Outpatient Medications:    allopurinol (ZYLOPRIM) 100 MG tablet, Take 100 mg by mouth daily., Disp: , Rfl:    amLODipine  (NORVASC ) 5 MG tablet, Take 1 tablet (5 mg total) by mouth daily., Disp: 30 tablet, Rfl: 1   apixaban  (ELIQUIS ) 5 MG TABS tablet, Take 1 tablet (5 mg total) by mouth 2 (two) times daily.,  Disp: 180 tablet, Rfl: 3   atorvastatin (LIPITOR) 40 MG tablet, Take 40 mg by mouth at bedtime., Disp: , Rfl:    carboxymethylcellulose (REFRESH PLUS) 0.5 % SOLN, Place 1 drop into both eyes 3 (three) times daily as needed., Disp: , Rfl:    Cyanocobalamin  (VITAMIN B12 PO), Take by mouth., Disp: , Rfl:    lisinopril  (PRINIVIL ,ZESTRIL ) 10 MG tablet, Take 10 mg by mouth daily., Disp: , Rfl:    MAGnesium-Oxide 400 (240 Mg) MG tablet, Take 400 mg by mouth daily., Disp: , Rfl:    Nutritional Supplements (ENSURE CLEAR) LIQD, Take 1 Bottle by mouth daily., Disp: , Rfl:    psyllium (METAMUCIL) 58.6 % powder, Take 1 packet by mouth in the morning and at bedtime., Disp: , Rfl:    tadalafil (CIALIS) 20 MG tablet, Take 20 mg by mouth daily as needed for erectile dysfunction., Disp: , Rfl:    Vitamin D, Cholecalciferol, 25 MCG (1000 UT) TABS, Take 1 tablet by mouth daily., Disp: , Rfl:  Allergies  Allergen Reactions   Novocain [Procaine] Hypertension   Sulfa Antibiotics Other (See Comments)    shaky   Vardenafil Hcl Palpitations   Family History  Problem  Relation Age of Onset   Angina Mother    Clotting disorder Mother    Diabetes Father    Heart failure Father    Other Sister        kidney donor   Kidney disease Sister        ? cancer   Clotting disorder Sister    Heart disease Sister    Kidney disease Brother        kidney recipient   Heart failure Brother    Liver disease Brother        transplant recipient   Kidney disease Brother        transplant recipient   Heart attack Brother    Macular degeneration Brother    Diabetes Brother    Heart disease Maternal Grandmother    Colon cancer Neg Hx    Esophageal cancer Neg Hx    Inflammatory bowel disease Neg Hx    Pancreatic cancer Neg Hx    Rectal cancer Neg Hx    Stomach cancer Neg Hx    Social History   Socioeconomic History   Marital status: Married    Spouse name: Not on file   Number of children: 1   Years of education:  Not on file   Highest education level: Not on file  Occupational History   Occupation: retired  Tobacco Use   Smoking status: Never   Smokeless tobacco: Never  Vaping Use   Vaping status: Never Used  Substance and Sexual Activity   Alcohol use: Yes   Drug use: No   Sexual activity: Not on file  Other Topics Concern   Not on file  Social History Narrative   Not on file   Social Drivers of Health   Financial Resource Strain: Not on file  Food Insecurity: Not on file  Transportation Needs: Not on file  Physical Activity: Not on file  Stress: Not on file  Social Connections: Not on file  Intimate Partner Violence: Not on file    Physical Exam: There were no vitals filed for this visit. There is no height or weight on file to calculate BMI. GEN: NAD EYE: Sclerae anicteric ENT: MMM CV: Non-tachycardic GI: Soft, NT/ND NEURO:  Alert & Oriented x 3  Lab Results: No results for input(s): WBC, HGB, HCT, PLT in the last 72 hours. BMET No results for input(s): NA, K, CL, CO2, GLUCOSE, BUN, CREATININE, CALCIUM in the last 72 hours. LFT No results for input(s): PROT, ALBUMIN, AST, ALT, ALKPHOS, BILITOT, BILIDIR, IBILI in the last 72 hours. PT/INR No results for input(s): LABPROT, INR in the last 72 hours.   Impression / Plan: This is a 78 y.o.male who presents for EGD/Colonoscopy for evaluation of changes in bowel habits and rule out portal hypertension and enteritis per imaging.  The risks and benefits of endoscopic evaluation/treatment were discussed with the patient and/or family; these include but are not limited to the risk of perforation, infection, bleeding, missed lesions, lack of diagnosis, severe illness requiring hospitalization, as well as anesthesia and sedation related illnesses.  The patient's history has been reviewed, patient examined, no change in status, and deemed stable for procedure.  The patient and/or family was  provided an opportunity to ask questions and all were answered.  The patient and/or family is agreeable to proceed.    Aloha Finner, MD  Gastroenterology Advanced Endoscopy Office # [phone]

## 2024-11-09 NOTE — Progress Notes (Signed)
 Called to room to assist during endoscopic procedure.  Patient ID and intended procedure confirmed with present staff. Received instructions for my participation in the procedure from the performing physician.

## 2024-11-09 NOTE — Progress Notes (Signed)
 Sedate, gd SR, tolerated procedure well, VSS, report to RN

## 2024-11-09 NOTE — Op Note (Signed)
 [facility] Patient Name: Timothy Peterson Procedure Date: 11/09/2024 10:54 AM MRN: [id number] Endoscopist: Aloha Finner , MD, [id number] Age: 78 Referring MD:  Date of Birth: June 06, 1946 Gender: Male Account #: [id number] Procedure:                Colonoscopy Indications:              Chronic diarrhea, Clinically significant diarrhea                            of unexplained origin, Abnormal CT of the GI tract Medicines:                Monitored Anesthesia Care Procedure:                Pre-Anesthesia Assessment:                           - Prior to the procedure, a History and Physical                            was performed, and patient medications and                            allergies were reviewed. The patient's tolerance of                            previous anesthesia was also reviewed. The risks                            and benefits of the procedure and the sedation                            options and risks were discussed with the patient.                            All questions were answered, and informed consent                            was obtained. Prior Anticoagulants: The patient has                            taken Eliquis  (apixaban ), last dose was 2 days                            prior to procedure. ASA Grade Assessment: III - A                            patient with severe systemic disease. After                            reviewing the risks and benefits, the patient was                            deemed in satisfactory condition to undergo the  procedure.                           After obtaining informed consent, the colonoscope                            was passed under direct vision. Throughout the                            procedure, the patient's blood pressure, pulse, and                            oxygen saturations were monitored continuously. The                            Olympus Scope [id number] was  introduced through the                            anus and advanced to the 5 cm into the ileum. The                            colonoscopy was performed without difficulty. The                            patient tolerated the procedure. The quality of the                            bowel preparation was adequate. The terminal ileum,                            ileocecal valve, appendiceal orifice, and rectum                            were photographed. Scope In: 11:14:47 AM Scope Out: 11:26:32 AM Scope Withdrawal Time: 0 hours 8 minutes 48 seconds  Total Procedure Duration: 0 hours 11 minutes 45 seconds  Findings:                 The digital rectal exam findings include                            hemorrhoids. Pertinent negatives include no                            palpable rectal lesions.                           The left colon was moderately tortuous.                           The terminal ileum and ileocecal valve appeared                            normal. Biopsies were taken with a cold forceps for  histology.                           There was a small lipoma, at the hepatic flexure.                           Multiple small-mouthed diverticula were found in                            the left colon.                           Normal mucosa was found in the entire colon                            otherwise. Biopsies were taken with a cold forceps                            for histology.                           Non-bleeding non-thrombosed external and internal                            hemorrhoids were found during retroflexion, during                            perianal exam and during digital exam. The                            hemorrhoids were Grade II (internal hemorrhoids                            that prolapse but reduce spontaneously). Complications:            No immediate complications. Estimated Blood Loss:     Estimated blood loss was  minimal. Impression:               - Hemorrhoids found on digital rectal exam.                           - Tortuous left colon.                           - The examined portion of the ileum was normal.                            Biopsied.                           - Small lipoma at the hepatic flexure.                           - Diverticulosis in the left colon.                           - Normal mucosa in the entire examined  colon                            otherwise. Biopsied.                           - Non-bleeding non-thrombosed external and internal                            hemorrhoids. Recommendation:           - The patient will be observed post-procedure,                            until all discharge criteria are met.                           - Discharge patient to home.                           - Patient has a contact number available for                            emergencies. The signs and symptoms of potential                            delayed complications were discussed with the                            patient. Return to normal activities tomorrow.                            Written discharge instructions were provided to the                            patient.                           - High fiber diet.                           - Use FiberCon 1-2 tablets PO daily.                           - May restart Eliquis  on 11/26 PM.                           - Continue present medications.                           - Await pathology results.                           - If pathology is unremarkable, then will need to                            consider VCE to evaluate the continued enteritis  noted on recent cross-sectional imaging.                           - Repeat colonoscopy is not recommended for                            screening purposes due to age.                           - The findings and recommendations were discussed                             with the patient.                           - The findings and recommendations were discussed                            with the patient's family. Aloha Finner, MD 11/09/2024 11:31:57 AM

## 2024-11-09 NOTE — Patient Instructions (Addendum)
 -Restart Eliquis  (apixaban ) 11/10/2024 pm dose - High fiber diet. - Use FiberCon 1-2 tablets PO daily. Get over the counter  - Continue present medications. Start Pantoprazole  20 mg daily  - Await pathology results.  Not recommended for screening due to age. -handout provided hiatal hernia,, diverticulosis, hemorrhoids   YOU HAD AN ENDOSCOPIC PROCEDURE TODAY AT THE [facility]:   Refer to the procedure report that was given to you for any specific questions about what was found during the examination.  If the procedure report does not answer your questions, please call your gastroenterologist to clarify.  If you requested that your care partner not be given the details of your procedure findings, then the procedure report has been included in a sealed envelope for you to review at your convenience later.  YOU SHOULD EXPECT: Some feelings of bloating in the abdomen. Passage of more gas than usual.  Walking can help get rid of the air that was put into your GI tract during the procedure and reduce the bloating. If you had a lower endoscopy (such as a colonoscopy or flexible sigmoidoscopy) you may notice spotting of blood in your stool or on the toilet paper. If you underwent a bowel prep for your procedure, you may not have a normal bowel movement for a few days.  Please Note:  You might notice some irritation and congestion in your nose or some drainage.  This is from the oxygen used during your procedure.  There is no need for concern and it should clear up in a day or so.  SYMPTOMS TO REPORT IMMEDIATELY:  Following lower endoscopy (colonoscopy or flexible sigmoidoscopy):  Excessive amounts of blood in the stool  Significant tenderness or worsening of abdominal pains  Swelling of the abdomen that is new, acute  Fever of 100F or higher  Following upper endoscopy (EGD)  Vomiting of blood or coffee ground material  New chest pain or pain under the shoulder blades  Painful or  persistently difficult swallowing  New shortness of breath  Fever of 100F or higher  Black, tarry-looking stools  For urgent or emergent issues, a gastroenterologist can be reached at any hour by calling [phone]. Do not use MyChart messaging for urgent concerns.    DIET:  We do recommend a small meal at first, but then you may proceed to your regular diet.  Drink plenty of fluids but you should avoid alcoholic beverages for 24 hours.  ACTIVITY:  You should plan to take it easy for the rest of today and you should NOT DRIVE or use heavy machinery until tomorrow (because of the sedation medicines used during the test).    FOLLOW UP: Our staff will call the number listed on your records the next business day following your procedure.  We will call around 7:15- 8:00 am to check on you and address any questions or concerns that you may have regarding the information given to you following your procedure. If we do not reach you, we will leave a message.     If any biopsies were taken you will be contacted by phone or by letter within the next 1-3 weeks.  Please call us  at [phone] if you have not heard about the biopsies in 3 weeks.    SIGNATURES/CONFIDENTIALITY: You and/or your care partner have signed paperwork which will be entered into your electronic medical record.  These signatures attest to the fact that that the information above on your After Visit Summary has  been reviewed and is understood.  Full responsibility of the confidentiality of this discharge information lies with you and/or your care-partner.

## 2024-11-10 ENCOUNTER — Telehealth: Payer: Self-pay

## 2024-11-10 NOTE — Telephone Encounter (Signed)
 No answer after follow up call. Left VM.

## 2024-11-11 LAB — CALPROTECTIN: Calprotectin: 281 ug/g — ABNORMAL HIGH

## 2024-11-12 LAB — SURGICAL PATHOLOGY

## 2024-11-14 ENCOUNTER — Ambulatory Visit: Payer: Self-pay | Admitting: Gastroenterology

## 2024-11-16 ENCOUNTER — Telehealth: Payer: Self-pay

## 2024-11-16 NOTE — Telephone Encounter (Signed)
 The pt has been advised and states he will stay off PPI for 48 hours then restart and let us  know how he does.

## 2024-11-16 NOTE — Telephone Encounter (Signed)
 The pt was able to take the pantoprazole  last night and states he woke up to use the bathroom and was very dizzy, he also states that today is very dizzy and side effect of pantoprazole  has dizziness listed.  I have advised him not to take today, please advise how you would like him to proceed.

## 2024-11-16 NOTE — Telephone Encounter (Signed)
 Agree with holding for the next 48 hours Pantoprazole . If things improve then he should let us  know or we should check in on him after this trial. I would then try to restart the medication and see if this dizziness symptom recurs. If it does then we will certainly stop the medication and transition either to a different PPI or trial high dose Famotidine. Thanks. GM

## 2024-11-17 ENCOUNTER — Ambulatory Visit

## 2024-11-17 VITALS — BP 124/72 | HR 62 | Ht 71.0 in | Wt 182.6 lb

## 2024-11-17 DIAGNOSIS — Z87891 Personal history of nicotine dependence: Secondary | ICD-10-CM | POA: Diagnosis not present

## 2024-11-17 DIAGNOSIS — I4892 Unspecified atrial flutter: Secondary | ICD-10-CM

## 2024-11-17 DIAGNOSIS — I10 Essential (primary) hypertension: Secondary | ICD-10-CM | POA: Diagnosis not present

## 2024-11-17 DIAGNOSIS — E785 Hyperlipidemia, unspecified: Secondary | ICD-10-CM | POA: Diagnosis not present

## 2024-11-17 MED ORDER — AMLODIPINE BESYLATE 10 MG PO TABS: 10.0000 mg | ORAL_TABLET | Freq: Every day | ORAL | 3 refills | Status: AC

## 2024-11-17 NOTE — Patient Instructions (Signed)
 Medication Instructions:  INCREASE: Amlodipine  10 mg (1 tablet) daily  *If you need a refill on your cardiac medications before your next appointment, please call your pharmacy*  Lab Work: NONE If you have labs (blood work) drawn today and your tests are completely normal, you will receive your results only by: MyChart Message (if you have MyChart) OR A paper copy in the mail If you have any lab test that is abnormal or we need to change your treatment, we will call you to review the results.  Testing/Procedures: (CT CHEST LUNG CANCER SCREENING) Non-Cardiac CT scanning, (CAT scanning), is a noninvasive, special x-ray that produces cross-sectional images of the body using x-rays and a computer. CT scans help physicians diagnose and treat medical conditions. For some CT exams, a contrast material is used to enhance visibility in the area of the body being studied. CT scans provide greater clarity and reveal more details than regular x-ray exams.   Your provider recommends that you get a Vascular Ultrasound Aorta Medicare Screening  Follow-Up: At [facility], you and your health needs are our priority.  As part of our continuing mission to provide you with exceptional heart care, our providers are all part of one team.  This team includes your primary Cardiologist (physician) and Advanced Practice Providers or APPs (Physician Assistants and Nurse Practitioners) who all work together to provide you with the care you need, when you need it.  Your next appointment:   1 year(s)  Provider:   Dr Ren

## 2024-11-17 NOTE — Progress Notes (Signed)
       Cardiology Office Note Date:  11/17/2024  ID:  Timothy Peterson, Timothy Peterson 04-25-1946, MRN [id number] PCP:  [facility]  Cardiologist:   Joelle VEAR Ren Donley, MD  No chief complaint on file.   Problems Atrial Flutter Diagnosed 7/25 improved on dilt; no beta blockers 2/2 bradycardia No AC at this time TSH 4.3 8/25 HTN/HLD on AN40/LL10/AE10 ED visit for HTN urgency with SBP 200s 10/25 Bradycardia 35-pack year tobacco use- quit 17 years ago  Visits  09/25: Eliquis , TTE, EM --> 60-65%, Aflutter burden 1% 12/25: Increase amlo to 10, AAA and lung cancer screening  History of Present Illness: Timothy Peterson is a 78 y.o. male who presents for follow up.   He has been doing well since last visit.  Following his echocardiogram visit, he had a BP reading with systolic over 200 and was sent to the emergency room.  He was started on lisinopril  and discharge.  Since then, he has been checking his blood pressure every day.  His morning readings have been 130s to 140s, but in the evening, he can get up to 160s to 170s.  He denies any chest palpitation or dyspnea.  He reports improvement in his fatigue and denies any excessive bleeding on the Eliquis .  ROS: Otherwise negative  Physical Exam VS:  BP 124/72   Pulse 62   Ht 5' 11 (1.803 m)   Wt 182 lb 9.6 oz (82.8 kg)   SpO2 96%   BMI 25.47 kg/m  , BMI Body mass index is 25.47 kg/m. GEN: Well nourished, well developed, in no acute distress HEENT: normal Neck: no JVD, carotid bruits, or masses Cardiac: RRR; no murmurs, rubs, or gallops,no edema  Respiratory:  CTAB bilaterally, normal work of breathing GI: soft, nontender, nondistended, + BS Extremities: No LE edema Skin: warm and dry, no rash Neuro:  Strength and sensation are intact  Recent Labs: Reviewed  ASSESSMENT AND PLAN Tanya Marvin is a 78 y.o. male who presents for follow up.  - Regarding his atrial flutter, his burden on event monitor was 1%.  Given  normal EF and the fact that his fatigue has improved, I do not think rhythm control is needed at this time.  Will continue with Eliquis . - Regarding his blood pressure, he continues to be elevated despite his current regimen.  Will continue lisinopril  10 mg but increase his amlodipine  to 10 mg daily.  I explained that his PCP can manage blood pressure from here on out. - Given his significant tobacco use history, we will do a AAA and lung cancer screening. - We will see him back in 1 year.    Signed, Joelle VEAR Ren Donley, MD  11/17/2024 9:02 AM    [facility]

## 2024-11-19 ENCOUNTER — Ambulatory Visit (HOSPITAL_COMMUNITY): Admission: RE | Admit: 2024-11-19 | Discharge: 2024-11-19

## 2024-11-19 DIAGNOSIS — Z87891 Personal history of nicotine dependence: Secondary | ICD-10-CM

## 2024-11-26 ENCOUNTER — Ambulatory Visit: Payer: Self-pay

## 2024-12-01 ENCOUNTER — Encounter: Payer: Self-pay | Admitting: Gastroenterology

## 2024-12-01 ENCOUNTER — Ambulatory Visit (INDEPENDENT_AMBULATORY_CARE_PROVIDER_SITE_OTHER): Admitting: Gastroenterology

## 2024-12-01 VITALS — BP 122/60 | HR 60 | Ht 71.0 in | Wt 186.0 lb

## 2024-12-01 DIAGNOSIS — K529 Noninfective gastroenteritis and colitis, unspecified: Secondary | ICD-10-CM

## 2024-12-01 DIAGNOSIS — R197 Diarrhea, unspecified: Secondary | ICD-10-CM

## 2024-12-01 DIAGNOSIS — D649 Anemia, unspecified: Secondary | ICD-10-CM

## 2024-12-01 DIAGNOSIS — K449 Diaphragmatic hernia without obstruction or gangrene: Secondary | ICD-10-CM

## 2024-12-01 DIAGNOSIS — D696 Thrombocytopenia, unspecified: Secondary | ICD-10-CM

## 2024-12-01 DIAGNOSIS — R600 Localized edema: Secondary | ICD-10-CM | POA: Diagnosis not present

## 2024-12-01 DIAGNOSIS — Z7901 Long term (current) use of anticoagulants: Secondary | ICD-10-CM

## 2024-12-01 DIAGNOSIS — R194 Change in bowel habit: Secondary | ICD-10-CM

## 2024-12-01 DIAGNOSIS — R195 Other fecal abnormalities: Secondary | ICD-10-CM

## 2024-12-01 DIAGNOSIS — K2970 Gastritis, unspecified, without bleeding: Secondary | ICD-10-CM

## 2024-12-01 DIAGNOSIS — R933 Abnormal findings on diagnostic imaging of other parts of digestive tract: Secondary | ICD-10-CM

## 2024-12-01 NOTE — Patient Instructions (Addendum)
 Contact your Cardiologist or PCP regarding swelling.   ______________________________________________________  If your blood pressure at your visit was 140/90 or greater, please contact your primary care physician to follow up on this.  _______________________________________________________  If you are age 77 or older, your body mass index should be between 23-30. Your Body mass index is 25.94 kg/m. If this is out of the aforementioned range listed, please consider follow up with your Primary Care Provider.  If you are age 7 or younger, your body mass index should be between 19-25. Your Body mass index is 25.94 kg/m. If this is out of the aformentioned range listed, please consider follow up with your Primary Care Provider.   ________________________________________________________  The [facility] providers would like to encourage you to use MYCHART to communicate with providers for non-urgent requests or questions.  Due to long hold times on the telephone, sending your provider a message by [facility] may be a faster and more efficient way to get a response.  Please allow 48 business hours for a response.  Please remember that this is for non-urgent requests.  _______________________________________________________  [facility] is using a team-based approach to care.  Your team is made up of your doctor and two to three APPS. Our APPS (Nurse Practitioners and Physician Assistants) work with your physician to ensure care continuity for you. They are fully qualified to address your health concerns and develop a treatment plan. They communicate directly with your gastroenterologist to care for you. Seeing the Advanced Practice Practitioners on your physician's team can help you by facilitating care more promptly, often allowing for earlier appointments, access to diagnostic testing, procedures, and other specialty referrals.   Due to recent changes in healthcare laws, you may see the  results of your imaging and laboratory studies on MyChart before your provider has had a chance to review them.  We understand that in some cases there may be results that are confusing or concerning to you. Not all laboratory results come back in the same time frame and the provider may be waiting for multiple results in order to interpret others.  Please give us  48 hours in order for your provider to thoroughly review all the results before contacting the office for clarification of your results.   Thank you for choosing me and [facility].  Camie Furbish, PA-C

## 2024-12-01 NOTE — Progress Notes (Signed)
 Timothy Peterson [id number] February 08, 1946   Chief Complaint: Change in bowel habits  Referring Provider: [facility] Primary GI MD: Dr. Wilhelmenia  HPI: Timothy Peterson is a 78 y.o. male with past medical history of  hypertension, hypercholesterolemia, gout, GERD, prediabetes, OA, H. Pylori (?), new onset a. flutter who presents today for follow up.    Patient initially seen in office 07/13/2024 to discuss EGD/colonoscopy based on history of diarrhea and GERD which was being worked up initially by PCP.  At visit, patient endorsed having diarrhea since about February with prior workup including negative ova and parasites, negative GI pathogen panel, and negative C. difficile.  He did have a positive fecal lactoferrin and colonoscopy was recommended.  He had had some improvement on Metamucil.  Also endorsed history of GERD with intermittent symptoms occurring less than weekly. Also reported gradual weight loss of about 20 pounds over the last 5 years, following with a nutritionist.  Reported chronic anemia going on for 10 years with recent worsening.   Has had annual FIT testing from 2016-2024, all negative. EGD/colonoscopy 09/2004, both normal. No family history of GI cancers/adenomas.  No family history of IBD or liver disease.   07/11/2024 Patient seen in the ED for 1 week of intermittent palpitations which had become persistent.  Heart rate in the 140s according to home BP and pulse ox.  Denied any chest pain or shortness of breath.  He was tachycardic to the 130s at time of arrival, vitals otherwise reassuring.  In A. flutter on EKG at time of arrival to the ED but well-appearing.  Improved with Cardizem .  Converted to sinus rhythm.  Cardiology recommended against anticoagulation as rhythm was flutter and not fibrillation.  Stat outpatient referral to cardiology was placed.   Due to new onset atrial flutter, procedures were not scheduled at last visit.  Labs and CT scan were  ordered as well as fecal calprotectin.   Labs 07/13/2024: Stable macrocytic anemia with hemoglobin 12.9, hematocrit 37.5, MCV 100.2, normal WBC, low platelets 115, stable decreased kidney function, normal thyroid function, normal folate, normal iron/ferritin, low normal vitamin B12   Fecal calprotectin was elevated at 269.   CT A/P 07/23/2024 showed mild wall thickening and mucosal enhancement involving multiple distal small bowel loops in the right lower quadrant, with inflammatory changes in the right lower quadrant mesentery consistent with enteritis, and differential diagnosis includes Crohn's disease, infectious, or other inflammatory etiologies.   Additional recommendations from Dr. Wilhelmenia were to get a QuantiFERON TB Gold in addition to repeat GI pathogen panel and C. difficile, with consideration for empiric course of Flagyl  if negative findings.   QuantiFERON-TB gold was negative.  Negative C. difficile and GI pathogen panel.   He was started on course of Flagyl  250 mg 3 times daily for 10 days.   Looks like he is scheduled for an appointment with cardiology 09/10/2024.   After he had CT and blood work with us  had US  at the [place] of liver and spleen Results showed normal liver, enlarged spleen.  Seen 08/25/2024 at which time patient was stable without worsening symptoms.  Had actually had some improvement in diarrhea.  Plan was for follow-up with Dr. Wilhelmenia after cardiology consult to reevaluate and determine timing for procedures.  Seen by Dr. Wilhelmenia 10/27/2024.  Bowel habits had improved with Metamucil with semiformed stools once or twice daily.  Plan was to repeat CT A/P, schedule EGD and colonoscopy, with consideration for capsule endoscopy if endoscopy  results are negative and there remains abnormality within the updated imaging. FibroScan results were requested from the [place] for review.  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  Repeat CT showed  significant improvement in findings compared to prior study, but there still appeared to be some inflammation within the small bowel.  Inflammatory markers were repeated and normal. Fecal calprotectin remained elevated at 281.  Underwent EGD/colonoscopy 11/09/2024.  On EGD found to have a 1 cm hiatal hernia, erosive gastropathy without bleeding and no stigmata of recent bleeding which was biopsied and pathology with no specific findings.  No evidence of celiac disease on duodenal biopsies. On colonoscopy found to have hemorrhoids, a tortuous left colon, small lipoma at the hepatic flexure, diverticulosis, hemorrhoids.  Pathology without any evidence of active inflammation.  Recommendation from Dr. Wilhelmenia was to consider video capsule endoscopy for further evaluation of the small bowel.  Patient opted to follow-up to discuss further.  Patient called 11/16/2024 stating he felt dizzy as a side effect of pantoprazole .  Was advised to hold for the next 48 hours, then restart the medication to see if dizziness symptom recurs and if so would stop the medication and transition either to a different PPI or high-dose famotidine.   Discussed the use of AI scribe software for clinical note transcription with the patient, who gave verbal consent to proceed.  History of Present Illness Timothy Peterson is a 78 year old male with atrial fibrillation who presents for follow up.   Altered bowel habits - Chronic changes in bowel movements throughout the year - Initial diarrhea improved with Metamucil - Bowel movements returning to regularity following colonoscopy - Requires monitoring of Metamucil intake to avoid constipation  Small intestine inflammation - Elevated fecal calprotectin levels - CT scan findings of small intestine inflammation - Recent upper endoscopy and colonoscopy performed - Repeat stool test obtained  Abdominal and constitutional symptoms - No current abdominal pain - No  fever - No new rashes - Occasional chills attributed to weather  Peripheral edema and skin changes - Swelling in calves - Itching around ankles - Occasional color changes in skin of lower extremities  Cold extremities - Cold hands and feet  Anticoagulation therapy - History of atrial fibrillation - Currently on blood thinner  Musculoskeletal symptoms - No significant joint pain - Activity-related aches only  Gastroesophageal symptoms and medication effects - Currently taking pantoprazole  - No current acid reflux or heartburn - Concerns about dizziness, but no clear correlation with pantoprazole  after restarting  Cardiovascular evaluation - Recent follow-up with cardiology - Recent CT scan performed - Upcoming aortic ultrasound scheduled   Previous GI Procedures/Imaging   Colonoscopy 11/09/2024 - Hemorrhoids found on digital rectal exam.  - Tortuous left colon.  - The examined portion of the ileum was normal. Biopsied.  - Small lipoma at the hepatic flexure.  - Diverticulosis in the left colon.  - Normal mucosa in the entire examined colon otherwise. Biopsied.  - Non- bleeding non- thrombosed external and internal hemorrhoids. Path: 3. Surgical [P], small bowel, terminal ileum :      SMALL INTESTINAL MUCOSA WITHOUT SIGNIFICANT DIAGNOSTIC ALTERATION.      NEGATIVE FOR DYSPLASIA OR MALIGNANCY.       4. Surgical [P], random colon biopsy :      COLONIC MUCOSA WITH NO SIGNIFICANT DIAGNOSTIC ALTERATION.      NO EVIDENCE OF LYMPHOCYTIC COLITIS OR COLLAGENOUS COLITIS.      NEGATIVE FOR ACTIVITY, CHRONICITY, GRANULOMA, DYSPLASIA OR MALIGNANCY.  EGD 11/09/2024 - No gross lesions in the entire esophagus. Z- line irregular, 41 cm from the incisors.  - 1 cm hiatal hernia.  - Erosive gastropathy with no bleeding and no stigmata of recent bleeding. Biopsied.  - No gross lesions in the duodenal bulb, in the first portion of the duodenum and in the second portion of the  duodenum. Biopsied. Path: 1. Surgical [P], gastric biopsy :       GASTRIC ANTRAL/OXYNTIC MUCOSA WITH NO SIGNIFICANT DIAGNOSTIC ALTERATION.       NO H. PYLORI IDENTIFIED ON H&E STAIN.       NEGATIVE FOR INTESTINAL METAPLASIA OR DYSPLASIA.        2. Surgical [P], duodenal bulb, and 2nd portion of duodenum, distal duodenum :       DUODENAL MUCOSA WITH PRESERVED VILLOGLANDULAR ARCHITECTURE WITHOUT INCREASED       INTRAEPITHELIAL LYMPHOCYTES OR EVIDENCE OF ACTIVE INFLAMMATION.       NO EVIDENCE OF GLUTEN SENSITIVE ENTEROPATHY.   CT A/P 10/29/2024 IMPRESSION: 1. Segmental small bowel wall thickening in the right lower quadrant without obstruction, with decreased adjacent mesenteric inflammatory/edema changes compared to prior study. The differential diagnosis again includes inflammatory bowel disease e.g. Crohn's; infectious, or inflammatory etiologies. 2. Innumerable sigmoid diverticula without significant adjacent inflammatory change.  CT A/P 07/23/2024 Mild wall thickening and mucosal enhancement involving multiple distal small bowel loops in the right lower quadrant, with inflammatory changes in the right lower quadrant mesentery. This is consistent with enteritis, and differential diagnosis includes Crohn disease, infectious, or other inflammatory etiologies.   No evidence of bowel obstruction or abscess.   Colonic diverticulosis, without radiographic evidence of diverticulitis.   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     amLODipine  (NORVASC ) 10 MG tablet Take  1 tablet (10 mg total) by mouth daily. 90 tablet 3   apixaban  (ELIQUIS ) 5 MG TABS tablet Take 1 tablet (5 mg total) by mouth 2 (two) times daily. 180 tablet 3   atorvastatin (LIPITOR) 40 MG tablet Take 40 mg by mouth at bedtime.     carboxymethylcellulose (REFRESH PLUS) 0.5 % SOLN Place 1 drop into both eyes 3 (three) times daily as needed.     Cyanocobalamin  (VITAMIN B12 PO) Take by mouth.     lisinopril  (PRINIVIL ,ZESTRIL ) 10 MG tablet Take 10 mg by mouth daily.     MAGnesium-Oxide 400 (240 Mg) MG tablet Take 400 mg by mouth daily.     Nutritional Supplements (ENSURE CLEAR) LIQD Take 1 Bottle by mouth daily. (Patient taking differently: Take 1 Bottle by mouth daily. Pt is drinking boost)     pantoprazole  (PROTONIX ) 20 MG tablet Take 1 tablet (20 mg total) by mouth daily. 30 tablet 6   psyllium (METAMUCIL) 58.6 % powder Take 1 packet by mouth in the morning and at bedtime.     tadalafil (CIALIS) 20 MG tablet Take 20 mg by mouth daily as needed for erectile dysfunction.     Vitamin  D, Cholecalciferol, 25 MCG (1000 UT) TABS Take 1 tablet by mouth daily.     No current facility-administered medications for this visit.    Allergies as of 12/01/2024 - Review Complete 12/01/2024  Allergen Reaction Noted   Novocain [procaine] Hypertension 06/29/2016   Sulfa antibiotics Other (See Comments) 06/29/2016   Protonix  [pantoprazole ]  11/17/2024   Vardenafil hcl Palpitations 05/24/2008    Family History  Problem Relation Age of Onset   Angina Mother    Clotting disorder Mother    Diabetes Father    Heart failure Father    Other Sister        kidney donor   Kidney disease Sister        ? cancer   Clotting disorder Sister    Heart disease Sister    Kidney disease Brother        kidney recipient   Heart failure Brother    Liver disease Brother        transplant recipient   Kidney disease Brother        transplant recipient   Heart attack Brother    Macular degeneration Brother    Diabetes  Brother    Heart disease Maternal Grandmother    Colon cancer Neg Hx    Esophageal cancer Neg Hx    Inflammatory bowel disease Neg Hx    Pancreatic cancer Neg Hx    Rectal cancer Neg Hx    Stomach cancer Neg Hx     Social History[1]   Review of Systems:    Constitutional: No weight loss, fever, chills Cardiovascular: No chest pain Respiratory: No SOB  Gastrointestinal: See HPI and otherwise negative   Physical Exam:  Vital signs: BP 122/60   Pulse 60   Ht 5' 11 (1.803 m)   Wt 186 lb (84.4 kg)   BMI 25.94 kg/m   Wt Readings from Last 3 Encounters:  12/01/24 186 lb (84.4 kg)  11/17/24 182 lb 9.6 oz (82.8 kg)  11/09/24 184 lb (83.5 kg)    Constitutional: Pleasant, well-appearing male in NAD, alert and cooperative Head:  Normocephalic and atraumatic.  Respiratory: Respirations even and unlabored. Lungs clear to auscultation bilaterally.  No wheezes, crackles, or rhonchi.  Cardiovascular:  Regular rate and rhythm. No murmurs. Mild bilateral LE edema. Gastrointestinal:  Soft, nondistended, nontender. No rebound or guarding. Normal bowel sounds. No appreciable masses or hepatomegaly. Rectal:  Not performed.  Neurologic:  Alert and oriented x4;  grossly normal neurologically.  Skin:   Dry and intact without significant lesions or rashes. Psychiatric: Oriented to person, place and time. Demonstrates good judgement and reason without abnormal affect or behaviors.   RELEVANT LABS AND IMAGING: CBC    Component Value Date/Time   WBC 8.5 10/15/2024 1808   RBC 3.75 (L) 10/15/2024 1808   HGB 12.8 (L) 10/15/2024 1808   HCT 38.1 (L) 10/15/2024 1808   PLT 102 (L) 10/15/2024 1808   MCV 101.6 (H) 10/15/2024 1808   MCH 34.1 (H) 10/15/2024 1808   MCHC 33.6 10/15/2024 1808   RDW 13.5 10/15/2024 1808   LYMPHSABS 0.6 (L) 07/13/2024 1118   MONOABS 0.8 07/13/2024 1118   EOSABS 0.1 07/13/2024 1118   BASOSABS 0.0 07/13/2024 1118    CMP     Component Value Date/Time   NA 138  10/15/2024 1808   K 4.0 10/15/2024 1808   CL 103 10/15/2024 1808   CO2 24 10/15/2024 1808   GLUCOSE 109 (H) 10/15/2024 1808   BUN 18 10/15/2024  1808   CREATININE 1.10 10/15/2024 1808   CALCIUM 9.1 10/15/2024 1808   PROT 6.7 07/13/2024 1118   ALBUMIN 4.7 07/13/2024 1118   AST 17 07/13/2024 1118   ALT 12 07/13/2024 1118   ALKPHOS 82 07/13/2024 1118   BILITOT 0.5 07/13/2024 1118   GFRNONAA >60 10/15/2024 1808   GFRAA >60 06/29/2016 1838   Echocardiogram 10/15/2024 1. Left ventricular ejection fraction, by estimation, is 60 to 65% . Left ventricular ejection fraction by 3D volume is 59 % . The left ventricle has normal function. The left ventricle has no regional wall motion abnormalities. Left ventricular diastolic parameters were normal.  2. Right ventricular systolic function is normal. The right ventricular size is normal. Tricuspid regurgitation signal is inadequate for assessing PA pressure.  3. The mitral valve is normal in structure. Trivial mitral valve regurgitation. No evidence of mitral stenosis.  4. The aortic valve is tricuspid. Aortic valve regurgitation is trivial. Aortic valve sclerosis is present, with no evidence of aortic valve stenosis.  5. The inferior vena cava is dilated in size with > 50% respiratory variability, suggesting right atrial pressure of 8 mmHg.  Assessment/Plan:   Assessment & Plan  Change in bowel habits Abnormal finding on GI tract imaging Diarrhea Elevated fecal calprotectin Patient seen today for follow-up.  Has had a change in bowel habits this year and was previously having diarrhea which does seem to be improved with regular use of Metamucil.  Has had findings of small bowel inflammation on 2 prior CT scans, most recently done 10/29/2024, as well as elevated fecal calprotectin, most recently 281.  He underwent EGD/colonoscopy 11/09/2024 without evidence of IBD.  Was found to have gastritis, hemorrhoids, diverticulosis.  Advised to consider video  capsule endoscopy for further evaluation of the small bowel.  He is agreeable to this today so we will schedule. Explained rare risk of bowel obstruction.  - Schedule capsule endoscopy. - Continue current bowel regimen. - Follow up with Dr. Mansouraty post-capsule endoscopy.  LE edema Has some mild LE edema bilaterally on exam today. Looks like he was just recently started on amlodipine , possible cause? Advised to contact PCP and cardiology to discuss.   Camie Furbish, PA-C [facility] 12/01/2024, 11:34 AM  Patient Care Team: [facility] as PCP - General       [1]  Social History Tobacco Use   Smoking status: Never   Smokeless tobacco: Never  Vaping Use   Vaping status: Never Used  Substance Use Topics   Alcohol use: Yes   Drug use: No

## 2024-12-02 NOTE — Progress Notes (Signed)
 Attending Physician's Attestation   I have reviewed the chart.   I agree with the Advanced Practitioner's note, impression, and recommendations with any updates as below. Agree with VCE to evaluate the small bowel further. Agree with noted physical examination finding that may be CCB related but followup with Cardiology/PCP is very reasonable.   Aloha Finner, MD [facility] Advanced Endoscopy Office # [phone]

## 2024-12-03 ENCOUNTER — Ambulatory Visit

## 2024-12-03 ENCOUNTER — Telehealth: Payer: Self-pay

## 2024-12-03 ENCOUNTER — Ambulatory Visit (HOSPITAL_COMMUNITY)
Admission: RE | Admit: 2024-12-03 | Discharge: 2024-12-03 | Disposition: A | Discharge status: Home / Self Care | Source: Ambulatory Visit

## 2024-12-03 DIAGNOSIS — Z87891 Personal history of nicotine dependence: Secondary | ICD-10-CM | POA: Diagnosis not present

## 2024-12-03 DIAGNOSIS — Z136 Encounter for screening for cardiovascular disorders: Secondary | ICD-10-CM | POA: Diagnosis not present

## 2024-12-03 NOTE — Telephone Encounter (Signed)
 Patient just came from testing.  Complains of body swollen and says it has been that way for a while.  He was dizzy and had some chest pains yesterday  Says his weight is up also.  Dr Tilmon is his provider.  Sitting in Zone A 5th

## 2024-12-03 NOTE — Telephone Encounter (Signed)
 Met patient in lobby, 2 verifiers used. Patient reports he saw Dr. Ren on 11/17/24 and his amlodipine  was increased due to an ED visit for hypertensive urgency on 10/15/24. No leg edema documented at that time.   Today, patient reports he is very swollen below his knees and that he feels bloated. He does not know his most recent BP or HR. He is requesting to be seen today. Unfortunately we have no open appts, advised patient that if he needs to be seen today he would need to go to urgent care or ED. Patient verbalizes understanding and agreed to plan.

## 2024-12-06 ENCOUNTER — Telehealth: Payer: Self-pay

## 2024-12-06 NOTE — Telephone Encounter (Signed)
 Received the following message below from [facility]:  Patient just came from testing.  Has had chest pains yesterday (slight). Says he is swollen and his weight is up.  Says he doesn't not feel well overall.  Wants to talk to someone. [facility] is his provider.  Attempted to call the patient. Patient did not answer the phone and a message was left for him to call the office back.

## 2024-12-06 NOTE — Telephone Encounter (Signed)
 Called the patient and he stated that he really wasn't having any chest pains as was stated in the message. Patient was able to follow up and have a conversation with Dr. Ren today and had no further questions at this time.

## 2024-12-06 NOTE — Telephone Encounter (Signed)
"   I called the patient given his concern about leg swelling.  He reported bilateral leg swelling at the end of the day.  He denied any additional symptoms.  I recommended that he try compression stockings and follow-up with his PCP.  If there is any cardiac concern at that time, he can reach out to us  to set up an appointment.  I let him know that his abdominal ultrasound did not show any signs of aneurysm.  Joelle DEL. Ren Ny, MD [facility]  "

## 2024-12-06 NOTE — Telephone Encounter (Signed)
 PT returning call to nurse

## 2024-12-24 ENCOUNTER — Ambulatory Visit: Admitting: Gastroenterology

## 2024-12-24 DIAGNOSIS — K529 Noninfective gastroenteritis and colitis, unspecified: Secondary | ICD-10-CM

## 2024-12-24 DIAGNOSIS — R933 Abnormal findings on diagnostic imaging of other parts of digestive tract: Secondary | ICD-10-CM

## 2024-12-24 DIAGNOSIS — R634 Abnormal weight loss: Secondary | ICD-10-CM

## 2024-12-24 DIAGNOSIS — K56609 Unspecified intestinal obstruction, unspecified as to partial versus complete obstruction: Secondary | ICD-10-CM | POA: Diagnosis not present

## 2024-12-24 NOTE — Patient Instructions (Signed)
POST CAPSULE INSTRUCTIONS:   Contact our office immediately at [phone] if you suffer from any abdominal pain, nausea, or vomiting during capsule endoscopy. Do not eat or drink for at least 2 hours. After 2 hours you may have any of the following to drink: Water                           White grape juice 7-Up                            Chicken Bouillon Sprite                           Ginger Ale  After 4 hours you may have a light snack to include any of the following: A cup of soup               sandwich Bowl of cereal             Rice Toast                           Eggs 2-3 small cookies (i.e. vanilla wafers or graham crackers)  Return to this office at 4:00 pm. After this you will be able to return to your normal diet.  During your procedure do not go near anyone else that is having capsule endoscopy.  Do not be in close contact with an MRI machine or a radio or television tower.   Do not wear a heavy coat or sweater because your recorder may over heat and stop recording.   Do not disconnect the equipment or remove the belt at any time.  Since the Data Recorder is actually a small computer, it should be treated with utmost care and protection.  Avoid sudden movement and banging of the Data Recorder.    Do not do any heavy lifting or strenuous physical activity during the test especially if it involves sweating.  Do not bend over or stoop during capsule endoscopy.  During capsule endoscopy, you will need to verify every 15 minutes that the small light on top of the Data Recorder is blinking twice per second.  If for some reason it stops blinking, record the time and contact our office at [phone].                     

## 2024-12-24 NOTE — Progress Notes (Signed)
 CAPSULE ID: [id number] Exp: 2026-04-17 LOT: [id number]  Patient arrived for capsule endoscopy. Reported the prep went well. Confirmed patient is fasting. Explained dietary restrictions for the next few hours. Patient verbalized understanding. Opened capsule, ensured capsule was flashing and transmitting to the recorder prior to the patient swallowing the capsule. Patient swallowed capsule without difficulty. Patient told to call the office with any questions. Understands to return to the office today between 4:00 and 4:30 pm. No further questions by the conclusion of the visit.

## 2024-12-27 ENCOUNTER — Other Ambulatory Visit: Payer: Self-pay

## 2024-12-27 ENCOUNTER — Emergency Department (HOSPITAL_BASED_OUTPATIENT_CLINIC_OR_DEPARTMENT_OTHER)

## 2024-12-27 ENCOUNTER — Telehealth: Payer: Self-pay | Admitting: Gastroenterology

## 2024-12-27 ENCOUNTER — Encounter (HOSPITAL_BASED_OUTPATIENT_CLINIC_OR_DEPARTMENT_OTHER): Payer: Self-pay

## 2024-12-27 ENCOUNTER — Inpatient Hospital Stay (HOSPITAL_BASED_OUTPATIENT_CLINIC_OR_DEPARTMENT_OTHER)
Admission: EM | Admit: 2024-12-27 | Discharge: 2025-01-08 | DRG: 330 | Disposition: A | Discharge status: Home / Self Care | Attending: Internal Medicine | Admitting: Internal Medicine

## 2024-12-27 DIAGNOSIS — E860 Dehydration: Secondary | ICD-10-CM | POA: Diagnosis present

## 2024-12-27 DIAGNOSIS — K529 Noninfective gastroenteritis and colitis, unspecified: Secondary | ICD-10-CM | POA: Diagnosis present

## 2024-12-27 DIAGNOSIS — R109 Unspecified abdominal pain: Secondary | ICD-10-CM | POA: Diagnosis present

## 2024-12-27 DIAGNOSIS — K3189 Other diseases of stomach and duodenum: Secondary | ICD-10-CM | POA: Diagnosis present

## 2024-12-27 DIAGNOSIS — K641 Second degree hemorrhoids: Secondary | ICD-10-CM | POA: Diagnosis present

## 2024-12-27 DIAGNOSIS — R933 Abnormal findings on diagnostic imaging of other parts of digestive tract: Secondary | ICD-10-CM | POA: Diagnosis not present

## 2024-12-27 DIAGNOSIS — K9189 Other postprocedural complications and disorders of digestive system: Secondary | ICD-10-CM | POA: Diagnosis not present

## 2024-12-27 DIAGNOSIS — K5651 Intestinal adhesions [bands], with partial obstruction: Secondary | ICD-10-CM | POA: Diagnosis present

## 2024-12-27 DIAGNOSIS — K420 Umbilical hernia with obstruction, without gangrene: Secondary | ICD-10-CM | POA: Diagnosis present

## 2024-12-27 DIAGNOSIS — N170 Acute kidney failure with tubular necrosis: Secondary | ICD-10-CM | POA: Diagnosis not present

## 2024-12-27 DIAGNOSIS — T183XXA Foreign body in small intestine, initial encounter: Secondary | ICD-10-CM | POA: Diagnosis present

## 2024-12-27 DIAGNOSIS — E7800 Pure hypercholesterolemia, unspecified: Secondary | ICD-10-CM | POA: Diagnosis present

## 2024-12-27 DIAGNOSIS — Z832 Family history of diseases of the blood and blood-forming organs and certain disorders involving the immune mechanism: Secondary | ICD-10-CM | POA: Diagnosis not present

## 2024-12-27 DIAGNOSIS — I10 Essential (primary) hypertension: Secondary | ICD-10-CM | POA: Diagnosis present

## 2024-12-27 DIAGNOSIS — K50012 Crohn's disease of small intestine with intestinal obstruction: Secondary | ICD-10-CM | POA: Diagnosis present

## 2024-12-27 DIAGNOSIS — N179 Acute kidney failure, unspecified: Secondary | ICD-10-CM | POA: Diagnosis present

## 2024-12-27 DIAGNOSIS — D179 Benign lipomatous neoplasm, unspecified: Secondary | ICD-10-CM | POA: Diagnosis present

## 2024-12-27 DIAGNOSIS — I4892 Unspecified atrial flutter: Secondary | ICD-10-CM | POA: Diagnosis present

## 2024-12-27 DIAGNOSIS — J9811 Atelectasis: Secondary | ICD-10-CM | POA: Diagnosis not present

## 2024-12-27 DIAGNOSIS — Z8249 Family history of ischemic heart disease and other diseases of the circulatory system: Secondary | ICD-10-CM | POA: Diagnosis not present

## 2024-12-27 DIAGNOSIS — Z888 Allergy status to other drugs, medicaments and biological substances status: Secondary | ICD-10-CM | POA: Diagnosis not present

## 2024-12-27 DIAGNOSIS — G8929 Other chronic pain: Secondary | ICD-10-CM | POA: Diagnosis present

## 2024-12-27 DIAGNOSIS — Z882 Allergy status to sulfonamides status: Secondary | ICD-10-CM | POA: Diagnosis not present

## 2024-12-27 DIAGNOSIS — Z884 Allergy status to anesthetic agent status: Secondary | ICD-10-CM | POA: Diagnosis not present

## 2024-12-27 DIAGNOSIS — Z833 Family history of diabetes mellitus: Secondary | ICD-10-CM | POA: Diagnosis not present

## 2024-12-27 DIAGNOSIS — Z7901 Long term (current) use of anticoagulants: Secondary | ICD-10-CM | POA: Diagnosis not present

## 2024-12-27 DIAGNOSIS — K56699 Other intestinal obstruction unspecified as to partial versus complete obstruction: Secondary | ICD-10-CM | POA: Diagnosis present

## 2024-12-27 DIAGNOSIS — Z8419 Family history of other disorders of kidney and ureter: Secondary | ICD-10-CM

## 2024-12-27 DIAGNOSIS — R1030 Lower abdominal pain, unspecified: Secondary | ICD-10-CM | POA: Diagnosis present

## 2024-12-27 DIAGNOSIS — K56609 Unspecified intestinal obstruction, unspecified as to partial versus complete obstruction: Principal | ICD-10-CM

## 2024-12-27 DIAGNOSIS — E870 Hyperosmolality and hypernatremia: Secondary | ICD-10-CM | POA: Diagnosis not present

## 2024-12-27 DIAGNOSIS — M109 Gout, unspecified: Secondary | ICD-10-CM | POA: Diagnosis present

## 2024-12-27 DIAGNOSIS — Z79899 Other long term (current) drug therapy: Secondary | ICD-10-CM

## 2024-12-27 DIAGNOSIS — E785 Hyperlipidemia, unspecified: Secondary | ICD-10-CM | POA: Insufficient documentation

## 2024-12-27 LAB — URINALYSIS, ROUTINE W REFLEX MICROSCOPIC
Bilirubin Urine: NEGATIVE
Glucose, UA: NEGATIVE mg/dL
Hgb urine dipstick: NEGATIVE
Ketones, ur: NEGATIVE mg/dL
Leukocytes,Ua: NEGATIVE
Nitrite: NEGATIVE
Specific Gravity, Urine: 1.018 (ref 1.005–1.030)
pH: 5.5 (ref 5.0–8.0)

## 2024-12-27 LAB — COMPREHENSIVE METABOLIC PANEL WITH GFR
ALT: 17 U/L (ref 0–44)
AST: 20 U/L (ref 15–41)
Albumin: 4.6 g/dL (ref 3.5–5.0)
Alkaline Phosphatase: 117 U/L (ref 38–126)
Anion gap: 13 (ref 5–15)
BUN: 30 mg/dL — ABNORMAL HIGH (ref 8–23)
CO2: 20 mmol/L — ABNORMAL LOW (ref 22–32)
Calcium: 9.7 mg/dL (ref 8.9–10.3)
Chloride: 103 mmol/L (ref 98–111)
Creatinine, Ser: 1.63 mg/dL — ABNORMAL HIGH (ref 0.61–1.24)
GFR, Estimated: 43 mL/min — ABNORMAL LOW
Glucose, Bld: 133 mg/dL — ABNORMAL HIGH (ref 70–99)
Potassium: 5.1 mmol/L (ref 3.5–5.1)
Sodium: 136 mmol/L (ref 135–145)
Total Bilirubin: 0.7 mg/dL (ref 0.0–1.2)
Total Protein: 7 g/dL (ref 6.5–8.1)

## 2024-12-27 LAB — CBC
HCT: 37.8 % — ABNORMAL LOW (ref 39.0–52.0)
Hemoglobin: 13.2 g/dL (ref 13.0–17.0)
MCH: 34.6 pg — ABNORMAL HIGH (ref 26.0–34.0)
MCHC: 34.9 g/dL (ref 30.0–36.0)
MCV: 99 fL (ref 80.0–100.0)
Platelets: 151 K/uL (ref 150–400)
RBC: 3.82 MIL/uL — ABNORMAL LOW (ref 4.22–5.81)
RDW: 14.9 % (ref 11.5–15.5)
WBC: 13 K/uL — ABNORMAL HIGH (ref 4.0–10.5)
nRBC: 0 % (ref 0.0–0.2)

## 2024-12-27 LAB — OCCULT BLOOD X 1 CARD TO LAB, STOOL: Fecal Occult Bld: NEGATIVE

## 2024-12-27 LAB — LIPASE, BLOOD: Lipase: 22 U/L (ref 11–51)

## 2024-12-27 MED ORDER — MORPHINE SULFATE (PF) 4 MG/ML IV SOLN
4.0000 mg | Freq: Once | INTRAVENOUS | Status: DC
  Filled 2024-12-27: qty 1

## 2024-12-27 MED ORDER — HYDRALAZINE HCL 20 MG/ML IJ SOLN: 5.0000 mg | INTRAMUSCULAR | Status: DC | PRN

## 2024-12-27 MED ORDER — IOHEXOL 300 MG/ML  SOLN
100.0000 mL | Freq: Once | INTRAMUSCULAR | Status: AC | PRN
  Administered 2024-12-27: 100 mL via INTRAVENOUS

## 2024-12-27 MED ORDER — ONDANSETRON HCL 4 MG/2ML IJ SOLN
4.0000 mg | Freq: Once | INTRAMUSCULAR | Status: AC
  Administered 2024-12-27: 4 mg via INTRAVENOUS
  Filled 2024-12-27: qty 2

## 2024-12-27 MED ORDER — LACTATED RINGERS IV SOLN: INTRAVENOUS | Status: AC

## 2024-12-27 MED ORDER — SODIUM CHLORIDE 0.9 % IV BOLUS
500.0000 mL | Freq: Once | INTRAVENOUS | Status: AC
  Administered 2024-12-27: 500 mL via INTRAVENOUS

## 2024-12-27 NOTE — Telephone Encounter (Signed)
 Inbound call from patient stating he came in last week 12/24/24 for a capsule endo and is now having extreme abdominal pain, patient would like to speak to nurse and be advised on what to do. Requesting a call back  Please advise  Thank you

## 2024-12-27 NOTE — Telephone Encounter (Signed)
 Noted pt. aware

## 2024-12-27 NOTE — H&P (Incomplete)
 " History and Physical    Timothy Peterson FMW:[id number] DOB: 05-27-1946 DOA: 12/27/2024  Patient coming from: Home.  Chief Complaint: Abdominal pain.  HPI: Timothy Peterson is a 79 y.o. male with history of atrial flutter, gout, hypertension, hyperlipidemia was referred to the ER after patient was having abdominal pain.  Pain is mostly in the lower quadrant on the right side.  Patient has history of small bowel inflammation and has underwent EGD and colonoscopy in November 2025 which showed diverticulosis hemorrhoids and gastritis.  For further workup of the small bowel inflammation patient had capsule endoscopy done on December 24, 2024 3 days ago.  24 hours later patient started having severe abdominal pain in the lower quadrant.  Had 2 episodes of vomiting.  Last bowel movement was yesterday which was loose.  Patient has being treated for chronic diarrhea which improved with Metamucil until last 2 days ago when he had again loose stools.  ED Course: In the ER patient is afebrile.  Labs show creatinine of 1.6 was increased from 1.1 in October 2025.  WBC count of 13 hemoglobin 13.9.  CT abdomen pelvis done shows punctate versus early high-grade small bowel obstruction due to multisegment wall thickening in the distal ileum in the right lower quadrant possibly stricturing suggestive of an inflammatory ileitis such as Crohn's disease.  The endoscopic capsule is positioned between 2 thickened segments of the small bowel and the ileum.  General surgery was consulted.  Patient admitted for further management.   Review of Systems: As per HPI, rest all negative.   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 He has never used smokeless tobacco. He reports current alcohol use. He reports that he does not use drugs.  Allergies[1]  Family History  Problem Relation Age of Onset   Angina Mother    Clotting disorder Mother    Diabetes Father    Heart failure Father    Other Sister        kidney donor   Kidney disease Sister        ? cancer   Clotting disorder Sister    Heart disease Sister    Kidney disease Brother        kidney recipient   Heart failure Brother    Liver disease Brother        transplant recipient   Kidney disease Brother        transplant recipient   Heart attack Brother    Macular degeneration Brother    Diabetes Brother    Heart disease Maternal Grandmother    Colon cancer Neg Hx    Esophageal cancer Neg Hx    Inflammatory bowel disease Neg Hx    Pancreatic cancer Neg Hx    Rectal cancer Neg Hx    Stomach cancer Neg Hx     Prior to Admission medications  Medication Sig Start Date End Date Taking? Authorizing Provider  allopurinol (ZYLOPRIM) 100 MG tablet Take 100 mg by mouth daily. 06/09/24   [provider]  amLODipine  (NORVASC ) 10  MG tablet Take 1 tablet (10 mg total) by mouth daily. 11/17/24   Azobou Donley Joelle DEL, MD  apixaban  (ELIQUIS ) 5 MG TABS tablet Take 1 tablet (5 mg total) by mouth 2 (two) times daily. 09/10/24   Azobou Donley Joelle DEL, MD  atorvastatin (LIPITOR) 40 MG tablet Take 40 mg by mouth at bedtime. 12/12/23   [provider]  carboxymethylcellulose (REFRESH PLUS) 0.5 % SOLN Place 1 drop into both eyes 3 (three) times daily as needed. 05/21/24   [provider]  Cyanocobalamin  (VITAMIN B12 PO) Take by mouth.    [provider]  lisinopril  (PRINIVIL ,ZESTRIL ) 10 MG tablet Take 10 mg by mouth daily.    [provider]  MAGnesium-Oxide 400 (240 Mg) MG tablet Take 400 mg by mouth daily. 06/15/24   [provider]  Nutritional Supplements (ENSURE CLEAR) LIQD Take 1 Bottle  by mouth daily. Patient taking differently: Take 1 Bottle by mouth daily. Pt is drinking boost 12/30/23   [provider]  pantoprazole  (PROTONIX ) 20 MG tablet Take 1 tablet (20 mg total) by mouth daily. 11/09/24   Mansouraty, Gabriel Jr., MD  psyllium (METAMUCIL) 58.6 % powder Take 1 packet by mouth in the morning and at bedtime.    [provider]  tadalafil (CIALIS) 20 MG tablet Take 20 mg by mouth daily as needed for erectile dysfunction. 12/12/23   [provider]  Vitamin D, Cholecalciferol, 25 MCG (1000 UT) TABS Take 1 tablet by mouth daily. 04/11/24   [provider]    Physical Exam: Constitutional: Moderately built and nourished. Vitals:   12/27/24 1930 12/27/24 2111 12/27/24 2126 12/27/24 2207  BP: (!) 163/66 (!) 163/66  (!) 150/64  Pulse: 72 72  70  Resp: 15 16  18   Temp:   98.4 F (36.9 C) 98 F (36.7 C)  TempSrc:   Oral Oral  SpO2: 98% 98%  99%   Eyes: Anicteric no pallor. ENMT: No discharge from the ears eyes nose or mouth. Neck: No mass felt.  No neck rigidity. Respiratory: No rhonchi or crepitations. Cardiovascular: S1-S2 heard. Abdomen: Tenderness in the right lower quadrant.  No guarding or rigidity. Musculoskeletal: No edema. Skin: No rash. Neurologic: Alert awake oriented time place and person.  Moves all extremities. Psychiatric: Appears normal.  Normal affect.   Labs on Admission: I have personally reviewed following labs and imaging studies  CBC: Recent Labs  Lab 12/27/24 1430  WBC 13.0*  HGB 13.2  HCT 37.8*  MCV 99.0  PLT 151   Basic Metabolic Panel: Recent Labs  Lab 12/27/24 1430  NA 136  K 5.1  CL 103  CO2 20*  GLUCOSE 133*  BUN 30*  CREATININE 1.63*  CALCIUM 9.7   GFR: CrCl cannot be calculated (Unknown ideal weight.). Liver Function Tests: Recent Labs  Lab 12/27/24 1430  AST 20  ALT 17  ALKPHOS 117  BILITOT 0.7  PROT 7.0  ALBUMIN  4.6   Recent Labs  Lab 12/27/24 1430  LIPASE 22    No results for input(s): AMMONIA in the last 168 hours. Coagulation Profile: No results for input(s): INR, PROTIME in the last 168 hours. Cardiac Enzymes: No results for input(s): CKTOTAL, CKMB, CKMBINDEX, TROPONINI in the last 168 hours. BNP (last 3 results) No results for input(s): PROBNP in the last 8760 hours. HbA1C: No results for input(s): HGBA1C in the last 72 hours. CBG: No results for input(s): GLUCAP in the last 168 hours. Lipid Profile: No results for input(s): CHOL,  HDL, LDLCALC, TRIG, CHOLHDL, LDLDIRECT in the last 72 hours. Thyroid Function Tests: No results for input(s): TSH, T4TOTAL, FREET4, T3FREE, THYROIDAB in the last 72 hours. Anemia Panel: No results for input(s): VITAMINB12, FOLATE, FERRITIN, TIBC, IRON, RETICCTPCT in the last 72 hours. Urine analysis:    Component Value Date/Time   COLORURINE YELLOW 12/27/2024 1430   APPEARANCEUR CLEAR 12/27/2024 1430   LABSPEC 1.018 12/27/2024 1430   PHURINE 5.5 12/27/2024 1430   GLUCOSEU NEGATIVE 12/27/2024 1430   HGBUR NEGATIVE 12/27/2024 1430   BILIRUBINUR NEGATIVE 12/27/2024 1430   KETONESUR NEGATIVE 12/27/2024 1430   PROTEINUR TRACE (A) 12/27/2024 1430   NITRITE NEGATIVE 12/27/2024 1430   LEUKOCYTESUR NEGATIVE 12/27/2024 1430   Sepsis Labs: @LABRCNTIP (procalcitonin:4,lacticidven:4) )No results found for this or any previous visit (from the past 240 hours).   Radiological Exams on Admission: CT ABDOMEN PELVIS W CONTRAST Result Date: 12/27/2024 EXAM: CT ABDOMEN AND PELVIS WITH CONTRAST 12/27/2024 05:20:58 PM TECHNIQUE: CT of the abdomen and pelvis was performed with the administration of 100 mL of iohexol  (OMNIPAQUE ) 300 MG/ML solution. Multiplanar reformatted images are provided for review. Automated exposure control, iterative reconstruction, and/or weight-based adjustment of the mA/kV was utilized to reduce the radiation dose to as low as reasonably  achievable. COMPARISON: 10/29/2024 CLINICAL HISTORY: Abdominal pain, acute, nonlocalized. FINDINGS: LOWER CHEST: No acute abnormality. LIVER: The liver is unremarkable. GALLBLADDER AND BILE DUCTS: Gallbladder is unremarkable. No biliary ductal dilatation. SPLEEN: No acute abnormality. PANCREAS: No acute abnormality. ADRENAL GLANDS: No acute abnormality. KIDNEYS, URETERS AND BLADDER: No stones in the kidneys or ureters. No hydronephrosis. No perinephric or periureteral stranding. Urinary bladder is unremarkable. GI AND BOWEL: The stomach is decompressed with a small air fluid level present. Abnormal fluid filled distention of multiple segments of small bowel predominantly in the left abdomen measuring up to 4.2 cm in the pelvis. Abrupt narrowing in a segment of distal ileum in the right lower quadrant, where there is moderate wall thickening and mucosal enhancement, likely stricturing measuring 8.7 cm in length (axial 63-67). There is a likely endoscopic camera just downstream. Just a little bit further downstream in the ileum, there is a second region of wall thickening and mucosal enhancement with apparent narrowing measuring 5.1 cm in length (axial 58). Decompressed appendix with a small appendicolith in place. Descending and sigmoid colonic diverticulosis. No changes of acute diverticulitis. PERITONEUM AND RETROPERITONEUM: Small volume ascites, likely reactive. No free air. VASCULATURE: Aorta is normal in caliber. Diffuse aortoiliac atherosclerosis. LYMPH NODES: Couple of prominent ileocolic chain lymph nodes measuring up to 7 mm, likely reactive. REPRODUCTIVE ORGANS: Mild prostatomegaly. 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 No acute osseous abnormality. No focal soft tissue abnormality. IMPRESSION: 1. Punctate versus early high-grade small bowel obstruction due to multisegment wall thickening in the distal ileum in the right lower quadrant, possibly stricturing,  suggestive of an inflammatory ileitis, such as Crohn's disease. 2. Of note, the endoscopic capsule is positioned between two thickened segments of small bowel in the ileum. Continued radiographic follow-up is recommended to document passage. 3. Small volume ascites and interloop fluid, likely reactive. 4. Descending and sigmoid colonic diverticulosis. 5. Mild prostatomegaly. Electronically signed by: Rogelia Myers MD MD 12/27/2024 06:24 PM EST RP Workstation: [id number]    EKG: Independently reviewed.  Sinus rhythm.  Assessment/Plan Principal Problem:   Abdominal pain Active Problems:   Ileitis   ARF (acute renal failure)   Essential hypertension   HLD (hyperlipidemia)    Abdominal pain with  features concerning for high-grade small bowel obstruction with multi segment wall thickening of the distal ileum in the right lower quadrant possibly stricturing suggestive of inflammatory ileitis such as Crohn's disease.  General surgery has been consulted.  Will also consult patient's gastroenterologist  since the endoscopy capsule is positioned between the 2 segments of small bowel medium.  Keep patient n.p.o. IV fluids pain relief medications.  Check inflammatory markers given the concern for Crohn's disease. Acute renal failure likely from dehydration.  Gently hydrate follow metabolic panel. History of atrial flutter presently in sinus rhythm.  Not on any rate limiting medications.  Takes Eliquis .  Last dose was yesterday morning.  Will keep patient on heparin  bridging. Hypertension patient takes lisinopril  and amlodipine  at home.  Will keep patient n.p.o. and IV hydralazine  for blood pressure trends. History of gout takes allopurinol.  Presently NPO. Hyperlipidemia on statins.  Since patient has small bowel inflammation with concerning features for high-grade small bowel obstruction will need further management and more than 2 midnight stay.  DVT prophylaxis: Heparin  infusion. Code Status:  Full code. Family Communication: Discussed with patient. Disposition Plan: Medical floor. Consults called: General Surgery and gastroenterology. Admission status: Inpatient.         [1]  Allergies Allergen Reactions   Novocain [Procaine] Hypertension   Sulfa Antibiotics Other (See Comments)    shaky   Protonix  [Pantoprazole ]     Reports dizziness    Vardenafil Hcl Palpitations   "

## 2024-12-27 NOTE — ED Triage Notes (Signed)
 Capsule endoscopy on Friday. Saturday evening started having severe abd pain. 0200 Sunday am woke up with a cold sweat and nausea. Intermittent clear and coffee ground stools.

## 2024-12-27 NOTE — Telephone Encounter (Signed)
 Capsule endo on 12/24/24 and developed abd pain on Saturday 12/25/24. He began to have nausea and cold chills and sweats over night. He had a watery BM and felt better and then Sunday felt better but Sunday night the same episode occurred.  He is having what looks like coffee grown stools today.  Pain is intense across the center of the abd.  He has not seen the capsule pass.  He states the pain is a 10/10 that comes and goes but is a constant 7or 8 out of 10.  He has been advised to go to the ED for eval.  I will forward to the PA team for eval as well.

## 2024-12-27 NOTE — Progress Notes (Signed)
 Plan of Care Note for accepted transfer   Patient: Timothy Peterson MRN: [id number]   DOA: 12/27/2024  Facility requesting transfer: [facility]   Requesting Provider: Margit Paris, PA  Reason for transfer: Abdominal pain, possible SBO   Facility course: 79 yr old man with atrial flutter no longer on anticoagulation, HTN, HLD, and chronic diarrhea who was given an endoscopy capsule on 1/9 and now presents with severe abdominal pain and diarrhea.   He is afebrile with stable vitals. SCr is 1.63, WBC 13.0, and FOBT is negative. CT findings are concerning for possible SBO and also raise question of inflammatory ileitis.   Dr. Dasie of surgery was consulted and patient was given IVF and Zofran .   Plan of care: The patient is accepted for admission to Med-surg  unit, at [facility].   Author: Evalene GORMAN Sprinkles, MD 12/27/2024  Check [email or web address] for on-call coverage.  Nursing staff, Please call [facility] Admits & Consults System-Wide number on Amion as soon as patient's arrival, so appropriate admitting provider can evaluate the pt.

## 2024-12-27 NOTE — ED Provider Notes (Signed)
 " [facility] EMERGENCY DEPARTMENT AT [facility] Provider Note   CSN: [id number] Arrival date & time: 12/27/24  1406     Patient presents with: Abdominal Pain   Timothy Peterson is a 79 y.o. male.   Patient to ED for evaluation of abdominal pain that started 2 nights ago across the middle and lower abdomen. He reports having a capsule endoscopy 3 days ago and did well after the study until the following night. The pain subsided by morning (yesterday) but returned again but the evening and did not resolve. He reports nausea without vomiting, and describes multiple bowel movements of either clear liquid or, if there was stool, it was very dark. Patient is on Eliquis . No fever. No urinary symptoms. The pain does not radiate to the back.   The history is provided by the patient. No language interpreter was used.  Abdominal Pain      Prior to Admission medications  Medication Sig Start Date End Date Taking? Authorizing Provider  allopurinol (ZYLOPRIM) 100 MG tablet Take 100 mg by mouth daily. 06/09/24   [provider]  amLODipine  (NORVASC ) 10 MG tablet Take 1 tablet (10 mg total) by mouth daily. 11/17/24   Azobou Donley Joelle DEL, MD  apixaban  (ELIQUIS ) 5 MG TABS tablet Take 1 tablet (5 mg total) by mouth 2 (two) times daily. 09/10/24   Azobou Donley Joelle DEL, MD  atorvastatin (LIPITOR) 40 MG tablet Take 40 mg by mouth at bedtime. 12/12/23   [provider]  carboxymethylcellulose (REFRESH PLUS) 0.5 % SOLN Place 1 drop into both eyes 3 (three) times daily as needed. 05/21/24   [provider]  Cyanocobalamin  (VITAMIN B12 PO) Take by mouth.    [provider]  lisinopril  (PRINIVIL ,ZESTRIL ) 10 MG tablet Take 10 mg by mouth daily.    [provider]  MAGnesium-Oxide 400 (240 Mg) MG tablet Take 400 mg by mouth daily. 06/15/24   [provider]  Nutritional Supplements (ENSURE CLEAR) LIQD Take 1 Bottle by mouth daily. Patient taking  differently: Take 1 Bottle by mouth daily. Pt is drinking boost 12/30/23   [provider]  pantoprazole  (PROTONIX ) 20 MG tablet Take 1 tablet (20 mg total) by mouth daily. 11/09/24   Mansouraty, Gabriel Jr., MD  psyllium (METAMUCIL) 58.6 % powder Take 1 packet by mouth in the morning and at bedtime.    [provider]  tadalafil (CIALIS) 20 MG tablet Take 20 mg by mouth daily as needed for erectile dysfunction. 12/12/23   [provider]  Vitamin D, Cholecalciferol, 25 MCG (1000 UT) TABS Take 1 tablet by mouth daily. 04/11/24   [provider]    Allergies: Novocain [procaine], Sulfa antibiotics, Protonix  [pantoprazole ], and Vardenafil hcl    Review of Systems  Gastrointestinal:  Positive for abdominal pain.    Updated Vital Signs BP (!) 163/66   Pulse 72   Temp 97.6 F (36.4 C) (Oral)   Resp 15   SpO2 98%   Physical Exam Vitals and nursing note reviewed.  Constitutional:      Appearance: He is well-developed.  HENT:     Head: Normocephalic.  Cardiovascular:     Rate and Rhythm: Normal rate and regular rhythm.     Heart sounds: No murmur heard. Pulmonary:     Effort: Pulmonary effort is normal.     Breath sounds: Normal breath sounds. No wheezing, rhonchi or rales.  Abdominal:     General: Bowel sounds are normal. There is distension.  Palpations: Abdomen is soft.     Tenderness: There is abdominal tenderness (RLQ and hypogastric areas.). There is guarding. There is no rebound.  Musculoskeletal:        General: Normal range of motion.     Cervical back: Normal range of motion and neck supple.  Skin:    General: Skin is warm and dry.  Neurological:     General: No focal deficit present.     Mental Status: He is alert and oriented to person, place, and time.     (all labs ordered are listed, but only abnormal results are displayed) Labs Reviewed  COMPREHENSIVE METABOLIC PANEL WITH GFR - Abnormal; Notable for the following  components:      Result Value   CO2 20 (*)    Glucose, Bld 133 (*)    BUN 30 (*)    Creatinine, Ser 1.63 (*)    GFR, Estimated 43 (*)    All other components within normal limits  CBC - Abnormal; Notable for the following components:   WBC 13.0 (*)    RBC 3.82 (*)    HCT 37.8 (*)    MCH 34.6 (*)    All other components within normal limits  URINALYSIS, ROUTINE W REFLEX MICROSCOPIC - Abnormal; Notable for the following components:   Protein, ur TRACE (*)    All other components within normal limits  LIPASE, BLOOD  OCCULT BLOOD X 1 CARD TO LAB, STOOL    EKG: None  Radiology: CT ABDOMEN PELVIS W CONTRAST Result Date: 12/27/2024 EXAM: CT ABDOMEN AND PELVIS WITH CONTRAST 12/27/2024 05:20:58 PM TECHNIQUE: CT of the abdomen and pelvis was performed with the administration of 100 mL of iohexol  (OMNIPAQUE ) 300 MG/ML solution. Multiplanar reformatted images are provided for review. Automated exposure control, iterative reconstruction, and/or weight-based adjustment of the mA/kV was utilized to reduce the radiation dose to as low as reasonably achievable. COMPARISON: 10/29/2024 CLINICAL HISTORY: Abdominal pain, acute, nonlocalized. FINDINGS: LOWER CHEST: No acute abnormality. LIVER: The liver is unremarkable. GALLBLADDER AND BILE DUCTS: Gallbladder is unremarkable. No biliary ductal dilatation. SPLEEN: No acute abnormality. PANCREAS: No acute abnormality. ADRENAL GLANDS: No acute abnormality. KIDNEYS, URETERS AND BLADDER: No stones in the kidneys or ureters. No hydronephrosis. No perinephric or periureteral stranding. Urinary bladder is unremarkable. GI AND BOWEL: The stomach is decompressed with a small air fluid level present. Abnormal fluid filled distention of multiple segments of small bowel predominantly in the left abdomen measuring up to 4.2 cm in the pelvis. Abrupt narrowing in a segment of distal ileum in the right lower quadrant, where there is moderate wall thickening and mucosal  enhancement, likely stricturing measuring 8.7 cm in length (axial 63-67). There is a likely endoscopic camera just downstream. Just a little bit further downstream in the ileum, there is a second region of wall thickening and mucosal enhancement with apparent narrowing measuring 5.1 cm in length (axial 58). Decompressed appendix with a small appendicolith in place. Descending and sigmoid colonic diverticulosis. No changes of acute diverticulitis. PERITONEUM AND RETROPERITONEUM: Small volume ascites, likely reactive. No free air. VASCULATURE: Aorta is normal in caliber. Diffuse aortoiliac atherosclerosis. LYMPH NODES: Couple of prominent ileocolic chain lymph nodes measuring up to 7 mm, likely reactive. REPRODUCTIVE ORGANS: Mild prostatomegaly. 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 No acute osseous abnormality. No focal soft tissue abnormality. IMPRESSION: 1. Punctate versus early high-grade small bowel obstruction due to multisegment wall thickening in the distal ileum in the right lower quadrant,  possibly stricturing, suggestive of an inflammatory ileitis, such as Crohn's disease. 2. Of note, the endoscopic capsule is positioned between two thickened segments of small bowel in the ileum. Continued radiographic follow-up is recommended to document passage. 3. Small volume ascites and interloop fluid, likely reactive. 4. Descending and sigmoid colonic diverticulosis. 5. Mild prostatomegaly. Electronically signed by: Rogelia Myers MD MD 12/27/2024 06:24 PM EST RP Workstation: [id number]     Procedures   Medications Ordered in the ED  morphine  (PF) 4 MG/ML injection 4 mg (4 mg Intravenous Not Given 12/27/24 1703)  ondansetron  (ZOFRAN ) injection 4 mg (4 mg Intravenous Given 12/27/24 1659)  sodium chloride  0.9 % bolus 500 mL (0 mLs Intravenous Stopped 12/27/24 1828)  iohexol  (OMNIPAQUE ) 300 MG/ML solution 100 mL (100 mLs Intravenous Contrast Given 12/27/24 1713)     Clinical Course as of 12/27/24 2112  Mon Dec 27, 2024  1622 Patient with recent capsule endoscopy 3 days ago, abdominal pain starting 2 days ago. No fever. Abdomen slightly distended. Elevated WBC, mild AKI. CT pending.  [SU]  1834 CT results per radiology:  IMPRESSION: 1. Punctate versus early high-grade small bowel obstruction due to multisegment wall thickening in the distal ileum in the right lower quadrant, possibly stricturing, suggestive of an inflammatory ileitis, such as Crohn's disease. 2. Of note, the endoscopic capsule is positioned between two thickened segments of small bowel in the ileum. Continued radiographic follow-up is recommended to document passage. 3. Small volume ascites and interloop fluid, likely reactive. 4. Descending and sigmoid colonic diverticulosis. 5. Mild prostatomegaly.   [SU]  1849 Anticipate admit to medicine service, consult requested with surgery. [SU]  1940 Dr. Leonor Dawn, gen surg, contacted and reports her service will see the patient in consult.   [SU]  2005 Hospitalist accepting for admission. Patient prefers [facility]. Discussed with Dr. Charlton.   [SU]    Clinical Course User Index [SU] Odell Balls, PA-C                                 Medical Decision Making Amount and/or Complexity of Data Reviewed Labs: ordered. Radiology: ordered.  Risk Prescription drug management. Decision regarding hospitalization.        Final diagnoses:  SBO (small bowel obstruction) (HCC)  AKI (acute kidney injury)    ED Discharge Orders     None          Odell Balls RIGGERS 12/27/24 2112    Ruthe Cornet, DO 12/27/24 2122  "

## 2024-12-28 ENCOUNTER — Inpatient Hospital Stay (HOSPITAL_COMMUNITY)

## 2024-12-28 DIAGNOSIS — R933 Abnormal findings on diagnostic imaging of other parts of digestive tract: Secondary | ICD-10-CM | POA: Diagnosis not present

## 2024-12-28 DIAGNOSIS — N170 Acute kidney failure with tubular necrosis: Secondary | ICD-10-CM | POA: Diagnosis not present

## 2024-12-28 DIAGNOSIS — K56609 Unspecified intestinal obstruction, unspecified as to partial versus complete obstruction: Secondary | ICD-10-CM

## 2024-12-28 DIAGNOSIS — K529 Noninfective gastroenteritis and colitis, unspecified: Secondary | ICD-10-CM | POA: Diagnosis not present

## 2024-12-28 DIAGNOSIS — T183XXA Foreign body in small intestine, initial encounter: Secondary | ICD-10-CM | POA: Diagnosis not present

## 2024-12-28 DIAGNOSIS — Z7901 Long term (current) use of anticoagulants: Secondary | ICD-10-CM | POA: Diagnosis not present

## 2024-12-28 LAB — C-REACTIVE PROTEIN: CRP: 3.1 mg/dL — ABNORMAL HIGH

## 2024-12-28 LAB — CBC
HCT: 35.8 % — ABNORMAL LOW (ref 39.0–52.0)
Hemoglobin: 12.1 g/dL — ABNORMAL LOW (ref 13.0–17.0)
MCH: 34 pg (ref 26.0–34.0)
MCHC: 33.8 g/dL (ref 30.0–36.0)
MCV: 100.6 fL — ABNORMAL HIGH (ref 80.0–100.0)
Platelets: 125 K/uL — ABNORMAL LOW (ref 150–400)
RBC: 3.56 MIL/uL — ABNORMAL LOW (ref 4.22–5.81)
RDW: 14.8 % (ref 11.5–15.5)
WBC: 10.9 K/uL — ABNORMAL HIGH (ref 4.0–10.5)
nRBC: 0 % (ref 0.0–0.2)

## 2024-12-28 LAB — COMPREHENSIVE METABOLIC PANEL WITH GFR
ALT: 13 U/L (ref 0–44)
AST: 18 U/L (ref 15–41)
Albumin: 4 g/dL (ref 3.5–5.0)
Alkaline Phosphatase: 103 U/L (ref 38–126)
Anion gap: 9 (ref 5–15)
BUN: 32 mg/dL — ABNORMAL HIGH (ref 8–23)
CO2: 20 mmol/L — ABNORMAL LOW (ref 22–32)
Calcium: 9.1 mg/dL (ref 8.9–10.3)
Chloride: 106 mmol/L (ref 98–111)
Creatinine, Ser: 1.64 mg/dL — ABNORMAL HIGH (ref 0.61–1.24)
GFR, Estimated: 43 mL/min — ABNORMAL LOW
Glucose, Bld: 126 mg/dL — ABNORMAL HIGH (ref 70–99)
Potassium: 4.9 mmol/L (ref 3.5–5.1)
Sodium: 135 mmol/L (ref 135–145)
Total Bilirubin: 0.8 mg/dL (ref 0.0–1.2)
Total Protein: 6.3 g/dL — ABNORMAL LOW (ref 6.5–8.1)

## 2024-12-28 LAB — HEPARIN LEVEL (UNFRACTIONATED): Heparin Unfractionated: >1.1 [IU]/mL — ABNORMAL HIGH (ref 0.30–0.70)

## 2024-12-28 LAB — SEDIMENTATION RATE: Sed Rate: 25 mm/h — ABNORMAL HIGH (ref 0–16)

## 2024-12-28 LAB — APTT: aPTT: 72 s — ABNORMAL HIGH (ref 24–36)

## 2024-12-28 MED ORDER — MORPHINE SULFATE (PF) 2 MG/ML IV SOLN
1.0000 mg | INTRAVENOUS | Status: DC | PRN
  Administered 2024-12-28 – 2024-12-29 (×6): 1 mg via INTRAVENOUS
  Filled 2024-12-28 (×6): qty 1

## 2024-12-28 MED ORDER — PHENOL 1.4 % MT LIQD
1.0000 | OROMUCOSAL | Status: DC | PRN
  Administered 2024-12-28 – 2025-01-08 (×4): 1 via OROMUCOSAL
  Filled 2024-12-28 (×2): qty 177

## 2024-12-28 MED ORDER — DIATRIZOATE MEGLUMINE & SODIUM 66-10 % PO SOLN
90.0000 mL | Freq: Once | ORAL | Status: AC
  Administered 2024-12-28: 90 mL via NASOGASTRIC
  Filled 2024-12-28: qty 90

## 2024-12-28 MED ORDER — HEPARIN (PORCINE) 25000 UT/250ML-% IV SOLN
1100.0000 [IU]/h | INTRAVENOUS | Status: AC
  Administered 2024-12-28 – 2024-12-29 (×2): 1100 [IU]/h via INTRAVENOUS
  Filled 2024-12-28 (×2): qty 250

## 2024-12-28 NOTE — Progress Notes (Signed)
 PHARMACY - ANTICOAGULATION CONSULT NOTE  Pharmacy Consult for Heparin  Indication: atrial fibrillation  Allergies[1]  Patient Measurements:    Vital Signs: Temp: 98.6 F (37 C) (01/13 1348) Temp Source: Oral (01/13 1348) BP: 141/56 (01/13 1348) Pulse Rate: 77 (01/13 1348)  Labs: Recent Labs    12/27/24 1430 12/28/24 0105 12/28/24 1303  HGB 13.2 12.1*  --   HCT 37.8* 35.8*  --   PLT 151 125*  --   APTT  --   --  72*  HEPARINUNFRC  --   --  >1.10*  CREATININE 1.63* 1.64*  --     CrCl cannot be calculated (Unknown ideal weight.).   Medications:  Infusions:   heparin  1,100 Units/hr (12/28/24 0335)   lactated ringers  75 mL/hr at 12/28/24 0913    Assessment: 79 yo M admitted with abdominal pain.  Abdominal CT + for possible bowel obstruction v. Inflammatory ileitis.   He is on apixaban  PTA for Afib (last dose morning of 12/27/24) which is being held pending surgical consult.  Pharmacy consulted to dose IV heparin .  Baseline  labs: Hg & pltc WNL, Scr elevated from baseline at 1.63, FOBT negative.  Today, 12/28/2024: First aPTT therapeutic on heparin  at 1100 units/hr Heparin  level elevated as expected d/t recent DOAC Hgb and Plt both borderline low; down slightly from admission SCr elevated on admission, likely d/t dehydration from SBO No bleeding or infusion issues per RN   Goal of Therapy:  Heparin  level 0.3-0.7 units/ml aPTT 66-102 seconds Monitor platelets by anticoagulation protocol: Yes   Plan:  Continue IV heparin  at 1100 units/hr. Check confirmatory aPTT with AM labs Daily heparin  level & CBC while on heparin ; aPTT as needed while DOAC effects remain Monitor closely for s/sx of bleeding F/U surgery recommendations/ability to resume apixaban     Kaelene Elliston A PharmD 12/28/2024,3:18 PM    [1]  Allergies Allergen Reactions   Novocain [Procaine] Hypertension   Sulfa Antibiotics Other (See Comments)    shaky   Protonix  [Pantoprazole ]     Reports  dizziness    Vardenafil Hcl Palpitations

## 2024-12-28 NOTE — Consult Note (Addendum)
 "    Mackie Holness 1946-04-04  [id number].    Requesting MD: Dr. Renato Applebaum Chief Complaint/Reason for Consult: SBO  HPI: Keyron Pokorski is a 79 y.o. male with a hx of HTN, HLD, A. Flutter on Eliquis  (LD 1/12 AM) who presented the ED for abdominal pain.  On chart review patient has been seen by GI over the last several months for history of diarrhea, with prior CT scan showing enteritis from ? IBD as well as gradual 20 pound weight loss for the last 5 years.  Workup so far has included negative C. difficile testing, negative GI panel testing, and colonoscopy on 11/09/2024 with results as below. He underwent capsule endoscopy on 1/9 for further workup.  Within 24 hours after he began having severe abdominal pain in the lower abdomen with associated nausea, hiccups, burping/belching.  Last bowel movement was 2d, dark and loose. Last episode of flatus was yesterday.  He underwent workup that showed findings concerning for SBO.  He was admitted to [facility].  General surgery was asked to see. He reports that he continues to have abdominal pain and feels distended. 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 No other abdominal surgeries.   Colonoscopy  - The digital rectal exam findings include hemorrhoids. Pertinent negatives include no palpable rectal lesions.  - The left colon was moderately tortuous.  - The terminal ileum and ileocecal valve appeared normal. Biopsies were taken with a cold forceps for histology.  - There was a small lipoma, at the hepatic flexure.  - Multiple small- mouthed diverticula were found in the left colon.  - Normal mucosa was found in the entire colon otherwise. Biopsies were taken with a cold forceps for histology.  - Non- bleeding non- thrombosed external and internal hemorrhoids were found during retroflexion, during perianal exam and during digital exam. The hemorrhoids were Grade II ( internal hemorrhoids that prolapse but reduce  spontaneously) .  Path      Surgical [P], small bowel, terminal ileum :      SMALL INTESTINAL MUCOSA WITHOUT SIGNIFICANT DIAGNOSTIC ALTERATION.      NEGATIVE FOR DYSPLASIA OR MALIGNANCY.       Surgical [P], random colon biopsy :      COLONIC MUCOSA WITH NO SIGNIFICANT DIAGNOSTIC ALTERATION.      NO EVIDENCE OF LYMPHOCYTIC COLITIS OR COLLAGENOUS COLITIS.      NEGATIVE FOR ACTIVITY, CHRONICITY, GRANULOMA, DYSPLASIA OR MALIGNANCY.   ROS: ROS As above, see hpi  Family History  Problem Relation Age of Onset   Angina Mother    Clotting disorder Mother    Diabetes Father    Heart failure Father    Other Sister        kidney donor   Kidney disease Sister        ? cancer   Clotting disorder Sister    Heart disease Sister    Kidney disease Brother        kidney recipient   Heart failure Brother    Liver disease Brother        transplant recipient   Kidney disease Brother        transplant recipient   Heart attack Brother    Macular degeneration Brother    Diabetes Brother    Heart disease Maternal Grandmother    Colon cancer Neg Hx    Esophageal cancer Neg Hx    Inflammatory bowel disease Neg Hx    Pancreatic  cancer Neg Hx    Rectal cancer Neg Hx    Stomach cancer Neg Hx     Past Medical History:  Diagnosis Date   Atrial flutter (HCC)    Gouty arthritis    High cholesterol    Hypertension     Past Surgical History:  Procedure Laterality Date   KIDNEY SURGERY     for transpalnt donor but didn't take kidney   PILONIDAL CYST EXCISION     TONSILLECTOMY     age 78   WISDOM TOOTH EXTRACTION      Social History:  reports that he has never smoked. He has never used smokeless tobacco. He reports current alcohol use. He reports that he does not use drugs.  Allergies: Allergies[1]  Medications Prior to Admission  Medication Sig Dispense Refill   apixaban  (ELIQUIS ) 5 MG TABS tablet Take 1 tablet (5 mg total) by mouth 2 (two) times daily. 180 tablet 3   allopurinol  (ZYLOPRIM) 100 MG tablet Take 100 mg by mouth daily.     amLODipine  (NORVASC ) 10 MG tablet Take 1 tablet (10 mg total) by mouth daily. 90 tablet 3   atorvastatin (LIPITOR) 40 MG tablet Take 40 mg by mouth at bedtime.     carboxymethylcellulose (REFRESH PLUS) 0.5 % SOLN Place 1 drop into both eyes 3 (three) times daily as needed.     Cyanocobalamin  (VITAMIN B12 PO) Take by mouth.     lisinopril  (PRINIVIL ,ZESTRIL ) 10 MG tablet Take 10 mg by mouth daily.     MAGnesium-Oxide 400 (240 Mg) MG tablet Take 400 mg by mouth daily.     Nutritional Supplements (ENSURE CLEAR) LIQD Take 1 Bottle by mouth daily. (Patient taking differently: Take 1 Bottle by mouth daily. Pt is drinking boost)     pantoprazole  (PROTONIX ) 20 MG tablet Take 1 tablet (20 mg total) by mouth daily. 30 tablet 6   psyllium (METAMUCIL) 58.6 % powder Take 1 packet by mouth in the morning and at bedtime.     tadalafil (CIALIS) 20 MG tablet Take 20 mg by mouth daily as needed for erectile dysfunction.     Vitamin D, Cholecalciferol, 25 MCG (1000 UT) TABS Take 1 tablet by mouth daily.       Physical Exam: Blood pressure (!) 125/56, pulse 67, temperature 98.3 F (36.8 C), temperature source Oral, resp. rate 18, SpO2 98%. General: pleasant, WD/WN male who is laying in bed in NAD HEENT: head is normocephalic, atraumatic.  Sclera are non-icteric.  Heart: regular, rate, and rhythm.   Lungs: CTAB, no wheezes, rhonchi, or rales noted.  Respiratory effort nonlabored Abd: Distended but soft, diffuse ttp except in the RUQ that is worse in the periumbilical and left hemi-abdomen without rigidity or guarding. Small umbilical hernia that is reducible and without overlying skin changes. No masses, additional  hernias, or organomegaly MS: no BUE edema Skin: warm and dry  Psych: A&Ox4 with an appropriate affect Neuro: normal speech, thought process intact, gait not assessed   Results for orders placed or performed during the hospital encounter of  12/27/24 (from the past 48 hours)  Lipase, blood     Status: None   Collection Time: 12/27/24  2:30 PM  Result Value Ref Range   Lipase 22 11 - 51 U/L    Comment: Performed at [facility], [address] [place], [place] [address]  Comprehensive metabolic panel     Status: Abnormal   Collection Time: 12/27/24  2:30 PM  Result Value Ref  Range   Sodium 136 135 - 145 mmol/L   Potassium 5.1 3.5 - 5.1 mmol/L   Chloride 103 98 - 111 mmol/L   CO2 20 (L) 22 - 32 mmol/L   Glucose, Bld 133 (H) 70 - 99 mg/dL    Comment: Glucose reference range applies only to samples taken after fasting for at least 8 hours.   BUN 30 (H) 8 - 23 mg/dL   Creatinine, Ser 8.36 (H) 0.61 - 1.24 mg/dL   Calcium 9.7 8.9 - 89.6 mg/dL   Total Protein 7.0 6.5 - 8.1 g/dL   Albumin  4.6 3.5 - 5.0 g/dL   AST 20 15 - 41 U/L   ALT 17 0 - 44 U/L   Alkaline Phosphatase 117 38 - 126 U/L   Total Bilirubin 0.7 0.0 - 1.2 mg/dL   GFR, Estimated 43 (L) >60 mL/min    Comment: (NOTE) Calculated using the CKD-EPI Creatinine Equation (2021)    Anion gap 13 5 - 15    Comment: Performed at [facility], [address], [place], [place] [address]  CBC     Status: Abnormal   Collection Time: 12/27/24  2:30 PM  Result Value Ref Range   WBC 13.0 (H) 4.0 - 10.5 K/uL   RBC 3.82 (L) 4.22 - 5.81 MIL/uL   Hemoglobin 13.2 13.0 - 17.0 g/dL   HCT 62.1 (L) 60.9 - 47.9 %   MCV 99.0 80.0 - 100.0 fL   MCH 34.6 (H) 26.0 - 34.0 pg   MCHC 34.9 30.0 - 36.0 g/dL   RDW 85.0 88.4 - 84.4 %   Platelets 151 150 - 400 K/uL   nRBC 0.0 0.0 - 0.2 %    Comment: Performed at [facility], [address], [place], [place] [address]  Urinalysis, Routine w reflex microscopic -Urine, Clean Catch     Status: Abnormal   Collection Time: 12/27/24  2:30 PM  Result Value Ref Range   Color, Urine YELLOW YELLOW   APPearance CLEAR CLEAR   Specific Gravity, Urine 1.018 1.005 - 1.030   pH 5.5 5.0 - 8.0    Glucose, UA NEGATIVE NEGATIVE mg/dL   Hgb urine dipstick NEGATIVE NEGATIVE   Bilirubin Urine NEGATIVE NEGATIVE   Ketones, ur NEGATIVE NEGATIVE mg/dL   Protein, ur TRACE (A) NEGATIVE mg/dL   Nitrite NEGATIVE NEGATIVE   Leukocytes,Ua NEGATIVE NEGATIVE    Comment: Performed at [facility], [address] [place], [place] [address]  Occult blood card to lab, stool Provider will collect     Status: None   Collection Time: 12/27/24  6:30 PM  Result Value Ref Range   Fecal Occult Bld NEGATIVE NEGATIVE    Comment: Performed at [facility], [address], [place], [place] [address]  Comprehensive metabolic panel     Status: Abnormal   Collection Time: 12/28/24  1:05 AM  Result Value Ref Range   Sodium 135 135 - 145 mmol/L   Potassium 4.9 3.5 - 5.1 mmol/L   Chloride 106 98 - 111 mmol/L   CO2 20 (L) 22 - 32 mmol/L   Glucose, Bld 126 (H) 70 - 99 mg/dL    Comment: Glucose reference range applies only to samples taken after fasting for at least 8 hours.   BUN 32 (H) 8 - 23 mg/dL   Creatinine, Ser 8.35 (H) 0.61 - 1.24 mg/dL   Calcium 9.1 8.9 - 89.6 mg/dL   Total Protein 6.3 (L) 6.5 - 8.1 g/dL  Albumin  4.0 3.5 - 5.0 g/dL   AST 18 15 - 41 U/L   ALT 13 0 - 44 U/L   Alkaline Phosphatase 103 38 - 126 U/L   Total Bilirubin 0.8 0.0 - 1.2 mg/dL   GFR, Estimated 43 (L) >60 mL/min    Comment: (NOTE) Calculated using the CKD-EPI Creatinine Equation (2021)    Anion gap 9 5 - 15    Comment: Performed at [facility], [address], [place], [place] [address]  CBC     Status: Abnormal   Collection Time: 12/28/24  1:05 AM  Result Value Ref Range   WBC 10.9 (H) 4.0 - 10.5 K/uL   RBC 3.56 (L) 4.22 - 5.81 MIL/uL   Hemoglobin 12.1 (L) 13.0 - 17.0 g/dL   HCT 64.1 (L) 60.9 - 47.9 %   MCV 100.6 (H) 80.0 - 100.0 fL   MCH 34.0 26.0 - 34.0 pg   MCHC 33.8 30.0 - 36.0 g/dL   RDW 85.1 88.4 - 84.4 %   Platelets 125 (L) 150 - 400 K/uL   nRBC 0.0  0.0 - 0.2 %    Comment: Performed at [facility], [address] [place], [place] [address]  Sedimentation rate     Status: Abnormal   Collection Time: 12/28/24  1:05 AM  Result Value Ref Range   Sed Rate 25 (H) 0 - 16 mm/hr    Comment: Performed at [facility], [address] [place], [place] [address]  C-reactive protein     Status: Abnormal   Collection Time: 12/28/24  1:05 AM  Result Value Ref Range   CRP 3.1 (H) <1.0 mg/dL    Comment: Performed at [facility], [address] [place], [place] [address]   CT ABDOMEN PELVIS W CONTRAST Result Date: 12/27/2024 EXAM: CT ABDOMEN AND PELVIS WITH CONTRAST 12/27/2024 05:20:58 PM TECHNIQUE: CT of the abdomen and pelvis was performed with the administration of 100 mL of iohexol  (OMNIPAQUE ) 300 MG/ML solution. Multiplanar reformatted images are provided for review. Automated exposure control, iterative reconstruction, and/or weight-based adjustment of the mA/kV was utilized to reduce the radiation dose to as low as reasonably achievable. COMPARISON: 10/29/2024 CLINICAL HISTORY: Abdominal pain, acute, nonlocalized. FINDINGS: LOWER CHEST: No acute abnormality. LIVER: The liver is unremarkable. GALLBLADDER AND BILE DUCTS: Gallbladder is unremarkable. No biliary ductal dilatation. SPLEEN: No acute abnormality. PANCREAS: No acute abnormality. ADRENAL GLANDS: No acute abnormality. KIDNEYS, URETERS AND BLADDER: No stones in the kidneys or ureters. No hydronephrosis. No perinephric or periureteral stranding. Urinary bladder is unremarkable. GI AND BOWEL: The stomach is decompressed with a small air fluid level present. Abnormal fluid filled distention of multiple segments of small bowel predominantly in the left abdomen measuring up to 4.2 cm in the pelvis. Abrupt narrowing in a segment of distal ileum in the right lower quadrant, where there is moderate wall thickening and mucosal enhancement, likely stricturing  measuring 8.7 cm in length (axial 63-67). There is a likely endoscopic camera just downstream. Just a little bit further downstream in the ileum, there is a second region of wall thickening and mucosal enhancement with apparent narrowing measuring 5.1 cm in length (axial 58). Decompressed appendix with a small appendicolith in place. Descending and sigmoid colonic diverticulosis. No changes of acute diverticulitis. PERITONEUM AND RETROPERITONEUM: Small volume ascites, likely reactive. No free air. VASCULATURE: Aorta is normal in caliber. Diffuse aortoiliac atherosclerosis. LYMPH NODES: Couple of prominent ileocolic chain lymph nodes measuring up to  7 mm, likely reactive. REPRODUCTIVE ORGANS: Mild prostatomegaly. 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 No acute osseous abnormality. No focal soft tissue abnormality. IMPRESSION: 1. Punctate versus early high-grade small bowel obstruction due to multisegment wall thickening in the distal ileum in the right lower quadrant, possibly stricturing, suggestive of an inflammatory ileitis, such as Crohn's disease. 2. Of note, the endoscopic capsule is positioned between two thickened segments of small bowel in the ileum. Continued radiographic follow-up is recommended to document passage. 3. Small volume ascites and interloop fluid, likely reactive. 4. Descending and sigmoid colonic diverticulosis. 5. Mild prostatomegaly. Electronically signed by: Rogelia Myers MD MD 12/27/2024 06:24 PM EST RP Workstation: [id number]    Anti-infectives (From admission, onward)    None       Assessment/Plan SBO - CT w/ SBO like changes with distended small bowel loops, with abrubt narrowing in the segment of the distal ileum in the RLQ where there is moderate wall thickening and mucosal enhancement, likely felt to be from stricturing.  The capsule endoscopy is noted just downstream from this.  There is a second region of wall thickening and  mucosal enhancement with apparent narrowing distal to this as well that is suggestive of inflammatory ileitis.  Small volume ascites.  No free air. - HDS without fever, tachycardia or hypotension. No peritonitis on exam. WBC 10.9. No current indication for emergency surgery - Place NGT for decompression and keep NPO - Start SBO protocol - Recommend GI consult for area of RLQ suggestive inflammatory ileitis as patient is currently undergoing workup for IBD (noted reassuring Colonoscopy in Nov)  - Keep K >=4, Phos >= 3, Mg >= 2 and mobilize for bowel function - Hopefully patient will improve with conservative management. Given findings of stricturing and retained capsule endoscopy, I think he is at higher risk of needing srugery. If patient fails to improve with conservative management, he will require exploratory surgery during admission - Agree with medical admission. We will follow with you.  FEN - NPO, NGT to LIWS, IVF per [facility] VTE - SCDs, hold Eliquis  (LD 1/12 AM), Heparin  gtt ID - None  HTN HLD A. Flutter on Eliquis  (LD 1/12 AM)  I reviewed nursing notes, hospitalist notes, last 24 h vitals and pain scores, last 48 h intake and output, last 24 h labs and trends, and last 24 h imaging results.   Ozell CHRISTELLA Shaper, [facility] 12/28/2024, 8:55 AM Please see Amion for pager number during day hours 7:00am-4:30pm     [1]  Allergies Allergen Reactions   Novocain [Procaine] Hypertension   Sulfa Antibiotics Other (See Comments)    shaky   Protonix  [Pantoprazole ]     Reports dizziness    Vardenafil Hcl Palpitations   "

## 2024-12-28 NOTE — Consult Note (Addendum)
 "      Consultation  Referring Provider:  TRH  Primary Care Physician:  [facility] Primary Gastroenterologist:  Dr. Wilhelmenia       Reason for Consultation:     SBO and retained capsule  LOS: 1 day          HPI:   Ronzell Peterson is a 79 y.o. male with past medical history significant for atrial flutter, gout, hypertension, hyperlipidemia, and chronic diarrhea presents for evaluation of small bowel obstruction and retained capsule endoscopy.  Patient was recently seen by our office 11/2024, see this note for details.  Patient has a longstanding history of diarrhea (improved on Metamucil) and was having intermittent abdominal pain.  Previous CT scans that showed a small bowel inflammation and patient subsequently underwent EGD and colonoscopy 10/2024 which showed diverticulosis, hemorrhoids, gastritis.  Fecal calprotectin at that time was also elevated at 281 but had normal ESR/CRP  Patient had capsule endoscopy placed December 24, 2024 and within 24 hours of placement he began having severe abdominal pain in his lower abdomen with vomiting which prompted his visit to the ED.  Upon arrival -WBC 13 - Afebrile and hemodynamically stable -Creatinine 1.6 - CRP 3.1 (normal 1 month ago) - Sed rate 25 (normal 1 month ago) - CTAP shows punctate versus early high-grade small bowel obstruction due to multi segment wall thickening in the distal ileum in the right lower quadrant possibly stricturing suggestive of inflammatory ileitis.  Capsule endoscopy is positioned between 2 thickened segments of small bowel and the ileum  Patient states he is passing gas.  He does note abdominal distention.  States his last bowel movement was yesterday and was strictly water .  PREVIOUS GI WORKUP   Colonoscopy 11/09/2024 - Hemorrhoids found on digital rectal exam.  - Tortuous left colon.  - The examined portion of the ileum was normal. Biopsied.  - Small lipoma at the hepatic flexure.  -  Diverticulosis in the left colon.  - Normal mucosa in the entire examined colon otherwise. Biopsied.  - Non- bleeding non- thrombosed external and internal hemorrhoids. Path: 3. Surgical [P], small bowel, terminal ileum :      SMALL INTESTINAL MUCOSA WITHOUT SIGNIFICANT DIAGNOSTIC ALTERATION.      NEGATIVE FOR DYSPLASIA OR MALIGNANCY.       4. Surgical [P], random colon biopsy :      COLONIC MUCOSA WITH NO SIGNIFICANT DIAGNOSTIC ALTERATION.      NO EVIDENCE OF LYMPHOCYTIC COLITIS OR COLLAGENOUS COLITIS.      NEGATIVE FOR ACTIVITY, CHRONICITY, GRANULOMA, DYSPLASIA OR MALIGNANCY.     EGD 11/09/2024 - No gross lesions in the entire esophagus. Z- line irregular, 41 cm from the incisors.  - 1 cm hiatal hernia.  - Erosive gastropathy with no bleeding and no stigmata of recent bleeding. Biopsied.  - No gross lesions in the duodenal bulb, in the first portion of the duodenum and in the second portion of the duodenum. Biopsied. Path: 1. Surgical [P], gastric biopsy :       GASTRIC ANTRAL/OXYNTIC MUCOSA WITH NO SIGNIFICANT DIAGNOSTIC ALTERATION.       NO H. PYLORI IDENTIFIED ON H&E STAIN.       NEGATIVE FOR INTESTINAL METAPLASIA OR DYSPLASIA.        2. Surgical [P], duodenal bulb, and 2nd portion of duodenum, distal duodenum :       DUODENAL MUCOSA WITH PRESERVED VILLOGLANDULAR ARCHITECTURE WITHOUT INCREASED       INTRAEPITHELIAL LYMPHOCYTES OR EVIDENCE OF  ACTIVE INFLAMMATION.       NO EVIDENCE OF GLUTEN SENSITIVE ENTEROPATHY.    CT A/P 10/29/2024 IMPRESSION: 1. Segmental small bowel wall thickening in the right lower quadrant without obstruction, with decreased adjacent mesenteric inflammatory/edema changes compared to prior study. The differential diagnosis again includes inflammatory bowel disease e.g. Crohn's; infectious, or inflammatory etiologies. 2. Innumerable sigmoid diverticula without significant adjacent inflammatory change.   CT A/P 07/23/2024 Mild wall thickening and mucosal  enhancement involving multiple distal small bowel loops in the right lower quadrant, with inflammatory changes in the right lower quadrant mesentery. This is consistent with enteritis, and differential diagnosis includes Crohn disease, infectious, or other inflammatory etiologies.   No evidence of bowel obstruction or abscess.   Colonic diverticulosis, without radiographic evidence of diverticulitis.  Past Medical History:  Diagnosis Date   Atrial flutter (HCC)    Gouty arthritis    High cholesterol    Hypertension     Surgical History:  He  has a past surgical history that includes Tonsillectomy; Pilonidal cyst excision; Kidney surgery; and Wisdom tooth extraction. Family History:  His family history includes Angina in his mother; Clotting disorder in his mother and sister; Diabetes in his brother and father; Heart attack in his brother; Heart disease in his maternal grandmother and sister; Heart failure in his brother and father; Kidney disease in his brother, brother, and sister; Liver disease in his brother; Macular degeneration in his brother; Other in his sister. Social History:   reports that he has never smoked. He has never used smokeless tobacco. He reports current alcohol use. He reports that he does not use drugs.  Prior to Admission medications  Medication Sig Start Date End Date Taking? Authorizing Provider  apixaban  (ELIQUIS ) 5 MG TABS tablet Take 1 tablet (5 mg total) by mouth 2 (two) times daily. 09/10/24  Yes Azobou Donley Joelle DEL, MD  allopurinol (ZYLOPRIM) 100 MG tablet Take 100 mg by mouth daily. 06/09/24   [provider]  amLODipine  (NORVASC ) 10 MG tablet Take 1 tablet (10 mg total) by mouth daily. 11/17/24   Azobou Donley Joelle DEL, MD  atorvastatin (LIPITOR) 40 MG tablet Take 40 mg by mouth at bedtime. 12/12/23   [provider]  carboxymethylcellulose (REFRESH PLUS) 0.5 % SOLN Place 1 drop into both eyes 3 (three) times daily as needed.  05/21/24   [provider]  Cyanocobalamin  (VITAMIN B12 PO) Take by mouth.    [provider]  lisinopril  (PRINIVIL ,ZESTRIL ) 10 MG tablet Take 10 mg by mouth daily.    [provider]  MAGnesium-Oxide 400 (240 Mg) MG tablet Take 400 mg by mouth daily. 06/15/24   [provider]  Nutritional Supplements (ENSURE CLEAR) LIQD Take 1 Bottle by mouth daily. Patient taking differently: Take 1 Bottle by mouth daily. Pt is drinking boost 12/30/23   [provider]  pantoprazole  (PROTONIX ) 20 MG tablet Take 1 tablet (20 mg total) by mouth daily. 11/09/24   Mansouraty, Gabriel Jr., MD  psyllium (METAMUCIL) 58.6 % powder Take 1 packet by mouth in the morning and at bedtime.    [provider]  tadalafil (CIALIS) 20 MG tablet Take 20 mg by mouth daily as needed for erectile dysfunction. 12/12/23   [provider]  Vitamin D, Cholecalciferol, 25 MCG (1000 UT) TABS Take 1 tablet by mouth daily. 04/11/24   [provider]    Current Facility-Administered Medications  Medication Dose Route Frequency Provider Last Rate Last Admin   diatrizoate  meglumine -sodium (GASTROGRAFIN )  66-10 % solution 90 mL  90 mL Per NG tube Once Maczis, Michael M, PA-C       heparin  ADULT infusion 100 units/mL (25000 units/250mL)  1,100 Units/hr Intravenous Continuous Lilliston, Andrea M, RPH 11 mL/hr at 12/28/24 0335 1,100 Units/hr at 12/28/24 0335   hydrALAZINE  (APRESOLINE ) injection 5 mg  5 mg Intravenous Q4H PRN Franky Redia SAILOR, MD       lactated ringers  infusion   Intravenous Continuous Franky Redia SAILOR, MD 75 mL/hr at 12/28/24 0913 New Bag at 12/28/24 0913   morphine  (PF) 2 MG/ML injection 1 mg  1 mg Intravenous Q2H PRN Alto Isaiah CROME, NP   1 mg at 12/28/24 9093    Allergies as of 12/27/2024 - Review Complete 12/27/2024  Allergen Reaction Noted   Novocain [procaine] Hypertension 06/29/2016   Sulfa antibiotics Other (See Comments) 06/29/2016   Protonix   [pantoprazole ]  11/17/2024   Vardenafil hcl Palpitations 05/24/2008    Review of Systems  Constitutional:  Negative for chills, fever and weight loss.  HENT:  Negative for hearing loss and tinnitus.   Eyes:  Negative for blurred vision and double vision.  Respiratory:  Negative for cough and hemoptysis.   Cardiovascular:  Negative for chest pain and palpitations.  Gastrointestinal:  Positive for abdominal pain, diarrhea, nausea and vomiting. Negative for blood in stool, constipation, heartburn and melena.  Genitourinary:  Negative for dysuria and urgency.  Musculoskeletal:  Negative for myalgias and neck pain.  Skin:  Negative for itching and rash.  Neurological:  Negative for seizures and loss of consciousness.  Psychiatric/Behavioral:  Negative for depression and suicidal ideas.        Physical Exam:  Vital signs in last 24 hours: Temp:  [97.6 F (36.4 C)-98.4 F (36.9 C)] 98.3 F (36.8 C) (01/13 0537) Pulse Rate:  [64-72] 67 (01/13 0537) Resp:  [15-18] 18 (01/13 0537) BP: (125-163)/(53-68) 125/56 (01/13 0537) SpO2:  [96 %-100 %] 98 % (01/13 0537)   Last BM recorded by nurses in past 5 days No data recorded  Physical Exam Constitutional:      Appearance: He is ill-appearing.  HENT:     Head: Normocephalic and atraumatic.     Nose: Nose normal. No congestion.     Mouth/Throat:     Mouth: Mucous membranes are dry.     Pharynx: Oropharynx is clear.  Eyes:     General: No scleral icterus. Cardiovascular:     Rate and Rhythm: Normal rate and regular rhythm.  Pulmonary:     Effort: Pulmonary effort is normal. No respiratory distress.  Abdominal:     General: There is distension.     Palpations: Abdomen is soft.     Comments: Hypoactive bowel sounds. Generalized tenderness.   Musculoskeletal:        General: Normal range of motion.  Skin:    General: Skin is warm and dry.  Neurological:     General: No focal deficit present.     Mental Status: He is alert and  oriented to person, place, and time.  Psychiatric:        Mood and Affect: Mood normal.        Behavior: Behavior normal.        Thought Content: Thought content normal.        Judgment: Judgment normal.      LAB RESULTS: Recent Labs    12/27/24 1430 12/28/24 0105  WBC 13.0* 10.9*  HGB 13.2 12.1*  HCT 37.8* 35.8*  PLT 151  125*   BMET Recent Labs    12/27/24 1430 12/28/24 0105  NA 136 135  K 5.1 4.9  CL 103 106  CO2 20* 20*  GLUCOSE 133* 126*  BUN 30* 32*  CREATININE 1.63* 1.64*  CALCIUM 9.7 9.1   LFT Recent Labs    12/28/24 0105  PROT 6.3*  ALBUMIN  4.0  AST 18  ALT 13  ALKPHOS 103  BILITOT 0.8   PT/INR No results for input(s): LABPROT, INR in the last 72 hours.  STUDIES: CT ABDOMEN PELVIS W CONTRAST Result Date: 12/27/2024 EXAM: CT ABDOMEN AND PELVIS WITH CONTRAST 12/27/2024 05:20:58 PM TECHNIQUE: CT of the abdomen and pelvis was performed with the administration of 100 mL of iohexol  (OMNIPAQUE ) 300 MG/ML solution. Multiplanar reformatted images are provided for review. Automated exposure control, iterative reconstruction, and/or weight-based adjustment of the mA/kV was utilized to reduce the radiation dose to as low as reasonably achievable. COMPARISON: 10/29/2024 CLINICAL HISTORY: Abdominal pain, acute, nonlocalized. FINDINGS: LOWER CHEST: No acute abnormality. LIVER: The liver is unremarkable. GALLBLADDER AND BILE DUCTS: Gallbladder is unremarkable. No biliary ductal dilatation. SPLEEN: No acute abnormality. PANCREAS: No acute abnormality. ADRENAL GLANDS: No acute abnormality. KIDNEYS, URETERS AND BLADDER: No stones in the kidneys or ureters. No hydronephrosis. No perinephric or periureteral stranding. Urinary bladder is unremarkable. GI AND BOWEL: The stomach is decompressed with a small air fluid level present. Abnormal fluid filled distention of multiple segments of small bowel predominantly in the left abdomen measuring up to 4.2 cm in the pelvis. Abrupt  narrowing in a segment of distal ileum in the right lower quadrant, where there is moderate wall thickening and mucosal enhancement, likely stricturing measuring 8.7 cm in length (axial 63-67). There is a likely endoscopic camera just downstream. Just a little bit further downstream in the ileum, there is a second region of wall thickening and mucosal enhancement with apparent narrowing measuring 5.1 cm in length (axial 58). Decompressed appendix with a small appendicolith in place. Descending and sigmoid colonic diverticulosis. No changes of acute diverticulitis. PERITONEUM AND RETROPERITONEUM: Small volume ascites, likely reactive. No free air. VASCULATURE: Aorta is normal in caliber. Diffuse aortoiliac atherosclerosis. LYMPH NODES: Couple of prominent ileocolic chain lymph nodes measuring up to 7 mm, likely reactive. REPRODUCTIVE ORGANS: Mild prostatomegaly. 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 No acute osseous abnormality. No focal soft tissue abnormality. IMPRESSION: 1. Punctate versus early high-grade small bowel obstruction due to multisegment wall thickening in the distal ileum in the right lower quadrant, possibly stricturing, suggestive of an inflammatory ileitis, such as Crohn's disease. 2. Of note, the endoscopic capsule is positioned between two thickened segments of small bowel in the ileum. Continued radiographic follow-up is recommended to document passage. 3. Small volume ascites and interloop fluid, likely reactive. 4. Descending and sigmoid colonic diverticulosis. 5. Mild prostatomegaly. 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 VCE placed 1/9  with development of obstructive symptoms within 24 hours CTAP 1/12 with SBO and inflammatory ileitis characterized by by narrowing segment in distal ileum in the RLQ with moderate wall thickening and mucosal enhancement felt to be a stricturing with capsule endoscopy noted to be downstream from this with second regional wall thickening and mucosal  enhancement and apparent narrowing distal to this as well.  No free air CRP 3.1, sed rate 25, WBC 10.9 Afebrile, benign exam  Although surgery is planning conservative management, there is ultimately a potential this patient will require surgical intervention so initiating steroids would be high risk and contraindicated for this patient. Additionally, cannot definitively distinguish if these are inflammatory versus fibrostenotic strictures. Unable to obtain MRE as capsule is not MRI compatible. Also, cannot pursue a bowel purge to push capsule through in this obstructed patient. Could potentially consider starting patient on Infliximab while inpatient due to potential increased risks of steroids.   Plan   - Surgery plans for conservative management with NGT for decompression as they feel he is high risk for surgery at this time - Monitor electrolytes,  K >=4, Phos >= 3, Mg >= 2 - Encourage ambulation - hopefully patient improves on conservative management -- repeat CRP/ESR tomorrow -- will discuss plan with Dr. Albertus  Thank you for your kind consultation, we will continue to follow.   Timothy Peterson  12/28/2024, 10:51 AM  "

## 2024-12-28 NOTE — Progress Notes (Signed)
 " PROGRESS NOTE    Timothy Peterson  FMW:[id number] DOB: 06/05/1946 DOA: 12/27/2024 [facility]CP: [facility]    Brief Narrative:  79 year old with history of hypertension, hyperlipidemia, A-flutter on Eliquis  who has been followed by gastroenterology for the last several months with history of diarrhea and enteritis.  Patient also reported 20 pound weight loss last 5 years.  Recent EGD and colonoscopy, capsule endoscopy on 1/9.  After about 24 hours of capsule endoscopy patient started having lower abdominal discomfort with nausea hiccups and burping.  In the emergency room hemodynamically stable.  Complaining of abdominal pain relieved with morphine .  Creatinine 1.6 with recent creatinine of 1.1.  CT scan abdomen pelvis with high-grade small bowel obstruction, capsule endoscopy still at the ileum.  Admitted with GI and surgery consultation.  Subjective: Patient seen and examined.  Denies any nausea vomiting.  Mouth is dry.  He had intermittent pain mostly in the suprapubic area with some discomfort and cramping.  Not passing flatus.  Last bowel movement was a very small amount of loose stool yesterday in the ER.  Patient was seen by surgery and he was already aware about nurses placing an NG tube. Assessment & Plan:   Partial small bowel obstruction, high-grade small bowel obstruction as per CT scan. Chronic abdominal pain with enteritis: Still with significant symptoms. N.p.o., IV fluids, NG tube for decompression, Gastrografin  study after NG tube.  Close monitoring of electrolytes. Surgery and GI following.  Anticipating conservative management.  AKI from dehydration: Continue maintenance fluid and monitor.  CT scan did not show any evidence of urinary retention or hydronephrosis.   paroxysmal A-flutter: Patient is currently in sinus rhythm.  He is not on any rate control medications.  Therapeutic on Eliquis .  Last dose was 1/12 morning.  Currently bridging with heparin  in anticipation of  need for any surgery.  Will continue.  Essential hypertension: Blood pressures stable.  Lisinopril  on hold.  Hyperlipidemia: On statins.    DVT prophylaxis: SCDs Start: 12/27/24 2245 heparin  infusion   Code Status: Full code Family Communication: None at the bedside Disposition Plan: Status is: Inpatient Remains inpatient appropriate because: Significant symptoms, n.p.o., IV fluids     Consultants:  Gastroenterology General Surgery  Procedures:  None  Antimicrobials:  None     Objective: Vitals:   12/27/24 2207 12/28/24 0219 12/28/24 0537 12/28/24 1044  BP: (!) 150/64 (!) 137/53 (!) 125/56 (!) 140/52  Pulse: 70 66 67 68  Resp: 18 17 18 18   Temp: 98 F (36.7 C) 98 F (36.7 C) 98.3 F (36.8 C) 98 F (36.7 C)  TempSrc: Oral Oral Oral   SpO2: 99% 96% 98% 96%   No intake or output data in the 24 hours ending 12/28/24 1139 There were no vitals filed for this visit.  Examination:  General exam: Appears calm and comfortable after pain medication.  On room air. Patient is alert awake and oriented.  Pleasant interaction. Respiratory system: Clear to auscultation. Respiratory effort normal. Cardiovascular system: S1 & S2 heard, RRR.  Gastrointestinal system: Abdomen is mildly distended, soft.  No rigidity or guarding.  Bowel sounds are hyperactive. Central nervous system: Alert and oriented. No focal neurological deficits.      Data Reviewed: I have personally reviewed following labs and imaging studies  CBC: Recent Labs  Lab 12/27/24 1430 12/28/24 0105  WBC 13.0* 10.9*  HGB 13.2 12.1*  HCT 37.8* 35.8*  MCV 99.0 100.6*  PLT 151 125*   Basic Metabolic Panel: Recent Labs  Lab 12/27/24 1430 12/28/24 0105  NA 136 135  K 5.1 4.9  CL 103 106  CO2 20* 20*  GLUCOSE 133* 126*  BUN 30* 32*  CREATININE 1.63* 1.64*  CALCIUM 9.7 9.1   GFR: CrCl cannot be calculated (Unknown ideal weight.). Liver Function Tests: Recent Labs  Lab 12/27/24 1430  12/28/24 0105  AST 20 18  ALT 17 13  ALKPHOS 117 103  BILITOT 0.7 0.8  PROT 7.0 6.3*  ALBUMIN  4.6 4.0   Recent Labs  Lab 12/27/24 1430  LIPASE 22   No results for input(s): AMMONIA in the last 168 hours. Coagulation Profile: No results for input(s): INR, PROTIME in the last 168 hours. Cardiac Enzymes: No results for input(s): CKTOTAL, CKMB, CKMBINDEX, TROPONINI in the last 168 hours. BNP (last 3 results) No results for input(s): PROBNP in the last 8760 hours. HbA1C: No results for input(s): HGBA1C in the last 72 hours. CBG: No results for input(s): GLUCAP in the last 168 hours. Lipid Profile: No results for input(s): CHOL, HDL, LDLCALC, TRIG, CHOLHDL, LDLDIRECT in the last 72 hours. Thyroid Function Tests: No results for input(s): TSH, T4TOTAL, FREET4, T3FREE, THYROIDAB in the last 72 hours. Anemia Panel: No results for input(s): VITAMINB12, FOLATE, FERRITIN, TIBC, IRON, RETICCTPCT in the last 72 hours. Sepsis Labs: No results for input(s): PROCALCITON, LATICACIDVEN in the last 168 hours.  No results found for this or any previous visit (from the past 240 hours).       Radiology Studies: CT ABDOMEN PELVIS W CONTRAST Result Date: 12/27/2024 EXAM: CT ABDOMEN AND PELVIS WITH CONTRAST 12/27/2024 05:20:58 PM TECHNIQUE: CT of the abdomen and pelvis was performed with the administration of 100 mL of iohexol  (OMNIPAQUE ) 300 MG/ML solution. Multiplanar reformatted images are provided for review. Automated exposure control, iterative reconstruction, and/or weight-based adjustment of the mA/kV was utilized to reduce the radiation dose to as low as reasonably achievable. COMPARISON: 10/29/2024 CLINICAL HISTORY: Abdominal pain, acute, nonlocalized. FINDINGS: LOWER CHEST: No acute abnormality. LIVER: The liver is unremarkable. GALLBLADDER AND BILE DUCTS: Gallbladder is unremarkable. No biliary ductal dilatation. SPLEEN: No acute  abnormality. PANCREAS: No acute abnormality. ADRENAL GLANDS: No acute abnormality. KIDNEYS, URETERS AND BLADDER: No stones in the kidneys or ureters. No hydronephrosis. No perinephric or periureteral stranding. Urinary bladder is unremarkable. GI AND BOWEL: The stomach is decompressed with a small air fluid level present. Abnormal fluid filled distention of multiple segments of small bowel predominantly in the left abdomen measuring up to 4.2 cm in the pelvis. Abrupt narrowing in a segment of distal ileum in the right lower quadrant, where there is moderate wall thickening and mucosal enhancement, likely stricturing measuring 8.7 cm in length (axial 63-67). There is a likely endoscopic camera just downstream. Just a little bit further downstream in the ileum, there is a second region of wall thickening and mucosal enhancement with apparent narrowing measuring 5.1 cm in length (axial 58). Decompressed appendix with a small appendicolith in place. Descending and sigmoid colonic diverticulosis. No changes of acute diverticulitis. PERITONEUM AND RETROPERITONEUM: Small volume ascites, likely reactive. No free air. VASCULATURE: Aorta is normal in caliber. Diffuse aortoiliac atherosclerosis. LYMPH NODES: Couple of prominent ileocolic chain lymph nodes measuring up to 7 mm, likely reactive. REPRODUCTIVE ORGANS: Mild prostatomegaly. 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 No acute osseous abnormality. No focal soft tissue abnormality. IMPRESSION: 1. Punctate versus early high-grade small bowel obstruction due to multisegment wall thickening in the distal ileum in the right lower  quadrant, possibly stricturing, suggestive of an inflammatory ileitis, such as Crohn's disease. 2. Of note, the endoscopic capsule is positioned between two thickened segments of small bowel in the ileum. Continued radiographic follow-up is recommended to document passage. 3. Small volume ascites and interloop  fluid, likely reactive. 4. Descending and sigmoid colonic diverticulosis. 5. Mild prostatomegaly. Electronically signed by: Rogelia Myers MD MD 12/27/2024 06:24 PM EST RP Workstation: [id number]        Scheduled Meds:  diatrizoate  meglumine -sodium  90 mL Per NG tube Once   Continuous Infusions:  heparin  1,100 Units/hr (12/28/24 0335)   lactated ringers  75 mL/hr at 12/28/24 0913     LOS: 1 day    Time spent: 40 minutes    Renato Applebaum, MD [facility]   "

## 2024-12-28 NOTE — Progress Notes (Signed)
 PHARMACY - ANTICOAGULATION CONSULT NOTE  Pharmacy Consult for Heparin  Indication: atrial fibrillation  Allergies[1]  Patient Measurements:    Vital Signs: Temp: 98 F (36.7 C) (01/12 2207) Temp Source: Oral (01/12 2207) BP: 150/64 (01/12 2207) Pulse Rate: 70 (01/12 2207)  Labs: Recent Labs    12/27/24 1430 12/28/24 0105  HGB 13.2 12.1*  HCT 37.8* 35.8*  PLT 151 125*  CREATININE 1.63*  --     CrCl cannot be calculated (Unknown ideal weight.).   Medical History: Past Medical History:  Diagnosis Date   Atrial flutter (HCC)    Gouty arthritis    High cholesterol    Hypertension     Medications:  Infusions:   lactated ringers  75 mL/hr at 12/27/24 2322    Assessment: 79 yo M admitted with abdominal pain.  Abdominal CT + for possible bowel obstruction v. Inflammatory ileitis.   He is on apixaban  PTA for Afib (last dose morning of 12/27/24) which is being held pending surgical consult.  Pharmacy consulted to dose IV heparin .  Baseline  labs: Hg & pltc WNL, Scr elevated from baseline at 1.63, FOBT negative.  Goal of Therapy:  Heparin  level 0.3-0.7 units/ml aPTT 66-102 seconds Monitor platelets by anticoagulation protocol: Yes   Plan:  Start IV heparin  1100 units/hr.  No bolus since had apixaban  dose ~12h ago. Check aPTT & heparin  level in 8h  -Anticipate heparin  level will be elevated due to recent DOAC so   will plan to monitor heparin  using aPTT levels until these correlate    with heparin  levels Daily heparin  level & CBC while on heparin  Monitor closely for s/sx of bleeding F/U surgery recommendations/ability to resume apixaban     Rosaline Millet PharmD 12/28/2024,1:29 AM      [1]  Allergies Allergen Reactions   Novocain [Procaine] Hypertension   Sulfa Antibiotics Other (See Comments)    shaky   Protonix  [Pantoprazole ]     Reports dizziness    Vardenafil Hcl Palpitations

## 2024-12-28 NOTE — TOC Initial Note (Signed)
 Transition of Care [facility]) - Initial/Assessment Note    Patient Details  Name: Timothy Peterson MRN: [id number] Date of Birth: [birth year]  [facility] CM/SW Contact:    Doneta Glenys DASEN, RN Phone Number: 12/28/2024, 11:55 AM  Clinical Narrative:                 [facility] No IP CM needs identified.  Expected Discharge Plan: Home/Self Care Barriers to Discharge: No Barriers Identified   Patient Goals and CMS Choice Patient states their goals for this hospitalization and ongoing recovery are:: Home CMS Medicare.gov Compare Post Acute Care list provided to:: Patient Choice offered to / list presented to : Patient [facility] ownership interest in [facility].provided to:: Patient    Expected Discharge Plan and Services In-house Referral: NA Discharge Planning Services: CM Consult   Living arrangements for the past 2 months: Single Family Home                 DME Arranged: N/A DME Agency: NA       HH Arranged: NA HH Agency: NA        Prior Living Arrangements/Services Living arrangements for the past 2 months: Single Family Home Lives with:: Spouse Patient language and need for interpreter reviewed:: Yes Do you feel safe going back to the place where you live?: Yes      Need for Family Participation in Patient Care: No (Comment) Care giver support system in place?: Yes (comment) Current home services: DME (walker) Criminal Activity/Legal Involvement Pertinent to Current Situation/Hospitalization: No - Comment as needed  Activities of Daily Living   ADL Screening (condition at time of admission) Independently performs ADLs?: Yes (appropriate for developmental age) Is the patient deaf or have difficulty hearing?: No Does the patient have difficulty seeing, even when wearing glasses/contacts?: No Does the patient have difficulty concentrating, remembering, or making decisions?: No  Permission  Sought/Granted Permission sought to share information with : Case Manager Permission granted to share information with : Yes, Verbal Permission Granted  Share Information with NAME: SHAQUILL, ISEMAN, Emergency Contact  [phone]  Permission granted to share info w AG[facility]: [facility]        Emotional Assessment Appearance:: Appears stated age Attitude/Demeanor/Rapport: Engaged Affect (typically observed): Appropriate Orientation: : Oriented to Self, Oriented to Place, Oriented to  Time, Oriented to Situation Alcohol / Substance Use: Not Applicable Psych Involvement: No (comment)  Admission diagnosis:  SBO (small bowel obstruction) (HCC) [K56.609] AKI (acute kidney injury) [N17.9] Abdominal pain [R10.9] Patient Active Problem List   Diagnosis Date Noted   Abdominal pain 12/27/2024   ARF (acute renal failure) 12/27/2024   Essential hypertension 12/27/2024   HLD (hyperlipidemia) 12/27/2024   Atrial flutter (HCC) 12/27/2024   Chronic anticoagulation 10/29/2024   Thrombocytopenia 10/29/2024   Abnormal finding on GI tract imaging 10/29/2024   Ileitis 10/29/2024   Change in bowel habits 10/29/2024   PCP:  [facility] Pharmacy:   [facility] - [place], [place] - [address] [place] [address] Phone: [phone] Fax: [phone]     Social Drivers of Health (SDOH) Social History: SDOH Screenings   Food Insecurity: No Food Insecurity (12/27/2024)  Housing: Low Risk (12/27/2024)  Transportation Needs: No Transportation Needs (12/27/2024)  Utilities: Not At Risk (12/27/2024)  Social Connections: Moderately Integrated (12/27/2024)  Tobacco Use: Low Risk (12/27/2024)   SDOH Interventions:     Readmission Risk Interventions  12/28/2024   11:52 AM  Readmission Risk Prevention Plan  Post Dischage Appt Complete  Medication Screening Complete  Transportation Screening Complete

## 2024-12-28 NOTE — Plan of Care (Signed)

## 2024-12-28 NOTE — Progress Notes (Signed)
 Patient was admitted to unit RM #[id number] in stable condition. Patient complained of continuous pain to abdomen. PRN Morphine  given. Patient started on heparin  1,1000 units/ml at 11 ml/hr. Patient is in stable condition, no adverse reaction noted.

## 2024-12-28 NOTE — Plan of Care (Signed)
   Problem: Education: Goal: Knowledge of General Education information will improve Description Including pain rating scale, medication(s)/side effects and non-pharmacologic comfort measures Outcome: Progressing   Problem: Clinical Measurements: Goal: Ability to maintain clinical measurements within normal limits will improve Outcome: Progressing   Problem: Clinical Measurements: Goal: Will remain free from infection Outcome: Progressing

## 2024-12-29 DIAGNOSIS — T183XXA Foreign body in small intestine, initial encounter: Secondary | ICD-10-CM

## 2024-12-29 DIAGNOSIS — N170 Acute kidney failure with tubular necrosis: Secondary | ICD-10-CM | POA: Diagnosis not present

## 2024-12-29 DIAGNOSIS — K56609 Unspecified intestinal obstruction, unspecified as to partial versus complete obstruction: Secondary | ICD-10-CM | POA: Diagnosis not present

## 2024-12-29 LAB — BASIC METABOLIC PANEL WITH GFR
Anion gap: 16 — ABNORMAL HIGH (ref 5–15)
BUN: 31 mg/dL — ABNORMAL HIGH (ref 8–23)
CO2: 18 mmol/L — ABNORMAL LOW (ref 22–32)
Calcium: 9.2 mg/dL (ref 8.9–10.3)
Chloride: 107 mmol/L (ref 98–111)
Creatinine, Ser: 1.55 mg/dL — ABNORMAL HIGH (ref 0.61–1.24)
GFR, Estimated: 46 mL/min — ABNORMAL LOW
Glucose, Bld: 108 mg/dL — ABNORMAL HIGH (ref 70–99)
Potassium: 4.8 mmol/L (ref 3.5–5.1)
Sodium: 140 mmol/L (ref 135–145)

## 2024-12-29 LAB — CBC
HCT: 34.3 % — ABNORMAL LOW (ref 39.0–52.0)
Hemoglobin: 11.7 g/dL — ABNORMAL LOW (ref 13.0–17.0)
MCH: 34.5 pg — ABNORMAL HIGH (ref 26.0–34.0)
MCHC: 34.1 g/dL (ref 30.0–36.0)
MCV: 101.2 fL — ABNORMAL HIGH (ref 80.0–100.0)
Platelets: 113 K/uL — ABNORMAL LOW (ref 150–400)
RBC: 3.39 MIL/uL — ABNORMAL LOW (ref 4.22–5.81)
RDW: 14.6 % (ref 11.5–15.5)
WBC: 9.2 K/uL (ref 4.0–10.5)
nRBC: 0 % (ref 0.0–0.2)

## 2024-12-29 LAB — APTT
aPTT: 105 s — ABNORMAL HIGH (ref 24–36)
aPTT: 97 s — ABNORMAL HIGH (ref 24–36)

## 2024-12-29 LAB — PREALBUMIN: Prealbumin: 16 mg/dL — ABNORMAL LOW (ref 18–38)

## 2024-12-29 LAB — HEPARIN LEVEL (UNFRACTIONATED): Heparin Unfractionated: >1.1 [IU]/mL — ABNORMAL HIGH (ref 0.30–0.70)

## 2024-12-29 MED ORDER — LIDOCAINE VISCOUS HCL 2 % MT SOLN
15.0000 mL | OROMUCOSAL | Status: DC | PRN
  Administered 2024-12-29: 15 mL via OROMUCOSAL
  Filled 2024-12-29 (×3): qty 15

## 2024-12-29 MED ORDER — SODIUM CHLORIDE 0.9 % IV SOLN
2.0000 g | INTRAVENOUS | Status: AC
  Administered 2024-12-30: 2 g via INTRAVENOUS
  Filled 2024-12-29: qty 2

## 2024-12-29 MED ORDER — LACTATED RINGERS IV SOLN: INTRAVENOUS | Status: DC

## 2024-12-29 MED ORDER — METHYLPREDNISOLONE SODIUM SUCC 40 MG IJ SOLR
40.0000 mg | Freq: Once | INTRAMUSCULAR | Status: AC
  Administered 2024-12-29: 40 mg via INTRAVENOUS
  Filled 2024-12-29: qty 1

## 2024-12-29 MED ORDER — GUAIFENESIN-CODEINE 100-10 MG/5ML PO SOLN
5.0000 mL | Freq: Four times a day (QID) | ORAL | Status: DC | PRN
  Administered 2024-12-29: 5 mL via ORAL
  Filled 2024-12-29: qty 5

## 2024-12-29 NOTE — Consult Note (Addendum)
 WOC Nurse requested for preoperative stoma site marking  Discussed surgical procedure and possible stoma creation with patient.  Explained role of the WOC nurse team.  Provided the patient with educational booklet and provided samples of pouching options. Answered patient's questions.   Examined patient lying and sitting forward, limited assessment related to pain with repositioning.  Abd is tight and distended and may eventually reveal folds which are currently not visible when the swelling resolves.  Placed the marking in the patient's visual field, away from any creases or abdominal contour issues and within the rectus muscle.  Attempted to mark below the patient's belt line, but this was not possible, since he wears his pants low on his hips.   Marked for colostomy in the LLQ  __3__ cm to the left of the umbilicus and _2.5___cm below the umbilicus.  Marked for ileostomy in the RLQ  __3__cm to the right of the umbilicus and  _2.5___ cm below the umbilicus.  Patient's abdomen cleansed with CHG wipes at site markings, allowed to air dry prior to marking. Covered mark with thin film transparent dressing to preserve mark until date of surgery.  WOC team will follow post-op if the patient recieves an ostomy.   Please re-consult if further assistance is needed.  Thank-you,  Stephane Fought MSN, RN, CWOCN, CWCN-AP, CNS Contact Mon-Fri 0700-1500: [phone]

## 2024-12-29 NOTE — Progress Notes (Signed)
 " PROGRESS NOTE    Timothy Peterson  FMW:[id number] DOB: [birth year] DOA: 12/27/2024 PCP: [facility]    Brief Narrative:  79 year old with history of hypertension, hyperlipidemia, A-flutter on Eliquis  who has been followed by gastroenterology for the last several months with history of diarrhea and enteritis.  Patient also reported 20 pound weight loss last 5 years.  Recent EGD and colonoscopy, capsule endoscopy on 1/9.  After about 24 hours of capsule endoscopy patient started having lower abdominal discomfort with nausea hiccups and burping.  In the emergency room hemodynamically stable.  Complaining of abdominal pain relieved with morphine .  Creatinine 1.6 with recent creatinine of 1.1.  CT scan abdomen pelvis with high-grade small bowel obstruction, capsule endoscopy still at the ileum.  Admitted with GI and surgery consultation.  Subjective: Patient seen and examined.  A lot of discomfort due to NG tube in his throat.  Has some cough and mucoid secretions.  NG tube draining adequately.  Patient thinks his abdomen is less tense now.  Patient passing flatus.  No bowel movement yet.  Appropriately anxious. Patient seen with surgery at the bedside.   Assessment & Plan:   Partial small bowel obstruction, high-grade small bowel obstruction as per CT scan. 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 Still with significant symptoms. N.p.o., IV fluids, NG tube for decompression, Close monitoring of electrolytes. Surgery and GI following.  Anticipating conservative management.  AKI from dehydration: Continue maintenance fluid and monitor.  CT scan did not show any evidence of urinary retention or hydronephrosis.   paroxysmal A-flutter: Patient is currently in sinus rhythm.  He is not on any rate control medications.  Therapeutic on Eliquis .  Last dose was 1/12 morning.  Currently bridging with heparin  in anticipation of need for any surgery.  Will  continue.  Essential hypertension: Blood pressures stable.  Lisinopril  on hold.  Hyperlipidemia: On statins.  Hold until oral intake.    DVT prophylaxis: SCDs Start: 12/27/24 2245 heparin  infusion   Code Status: Full code Family Communication: None at the bedside Disposition Plan: Status is: Inpatient Remains inpatient appropriate because: Significant symptoms, n.p.o., IV fluids     Consultants:  Gastroenterology General Surgery  Procedures:  None  Antimicrobials:  None     Objective: Vitals:   12/28/24 1044 12/28/24 1348 12/28/24 1944 12/29/24 1309  BP: (!) 140/52 (!) 141/56 (!) 147/58 (!) 156/57  Pulse: 68 77 80 81  Resp: 18  18 18   Temp: 98 F (36.7 C) 98.6 F (37 C) 97.8 F (36.6 C) 98.6 F (37 C)  TempSrc:  Oral  Oral  SpO2: 96% 96% 95% 94%    Intake/Output Summary (Last 24 hours) at 12/29/2024 1313 Last data filed at 12/29/2024 0900 Gross per 24 hour  Intake 30 ml  Output 600 ml  Net -570 ml   There were no vitals filed for this visit.  Examination:  General exam: Appears calm and appropriately anxious.  On room air. Patient is alert awake and oriented.  Respiratory system: Clear to auscultation. Respiratory effort normal. Cardiovascular system: S1 & S2 heard, RRR.  Gastrointestinal system: Abdomen is mildly distended, soft.  No rigidity or guarding.  Bowel sounds are hyperactive. NG tube with active drainage, thick biliary fluid. Central nervous system: Alert and oriented. No focal neurological deficits.      Data Reviewed: I have personally reviewed following labs and imaging studies  CBC: Recent Labs  Lab 12/27/24 1430 12/28/24 0105 12/29/24 0449  WBC 13.0* 10.9* 9.2  HGB 13.2 12.1* 11.7*  HCT 37.8* 35.8* 34.3*  MCV 99.0 100.6* 101.2*  PLT 151 125* 113*   Basic Metabolic Panel: Recent Labs  Lab 12/27/24 1430 12/28/24 0105 12/29/24 0449  NA 136 135 140  K 5.1 4.9 4.8  CL 103 106 107  CO2 20* 20* 18*  GLUCOSE 133* 126*  108*  BUN 30* 32* 31*  CREATININE 1.63* 1.64* 1.55*  CALCIUM 9.7 9.1 9.2   GFR: CrCl cannot be calculated (Unknown ideal weight.). Liver Function Tests: Recent Labs  Lab 12/27/24 1430 12/28/24 0105  AST 20 18  ALT 17 13  ALKPHOS 117 103  BILITOT 0.7 0.8  PROT 7.0 6.3*  ALBUMIN  4.6 4.0   Recent Labs  Lab 12/27/24 1430  LIPASE 22   No results for input(s): AMMONIA in the last 168 hours. Coagulation Profile: No results for input(s): INR, PROTIME in the last 168 hours. Cardiac Enzymes: No results for input(s): CKTOTAL, CKMB, CKMBINDEX, TROPONINI in the last 168 hours. BNP (last 3 results) No results for input(s): PROBNP in the last 8760 hours. HbA1C: No results for input(s): HGBA1C in the last 72 hours. CBG: No results for input(s): GLUCAP in the last 168 hours. Lipid Profile: No results for input(s): CHOL, HDL, LDLCALC, TRIG, CHOLHDL, LDLDIRECT in the last 72 hours. Thyroid Function Tests: No results for input(s): TSH, T4TOTAL, FREET4, T3FREE, THYROIDAB in the last 72 hours. Anemia Panel: No results for input(s): VITAMINB12, FOLATE, FERRITIN, TIBC, IRON, RETICCTPCT in the last 72 hours. Sepsis Labs: No results for input(s): PROCALCITON, LATICACIDVEN in the last 168 hours.  No results found for this or any previous visit (from the past 240 hours).       Radiology Studies: DG Abd Portable 1V-Small Bowel Obstruction Protocol-initial, 8 hr delay Result Date: 12/28/2024 EXAM: 1 VIEW XRAY OF THE ABDOMEN 12/28/2024 11:15:33 PM COMPARISON: 12/28/2024 CLINICAL HISTORY: FINDINGS: LINES, TUBES AND DEVICES: Endoscopic capsule in midline pelvis. Enteric tube in gastric fundus. BOWEL: Dilated gas-filled small bowel loops throughout the abdomen, not appreciably changed. No contrast is noted within the colon. SOFT TISSUES: Contrast is seen within the bladder from a prior CT examination. No abnormal calcifications. BONES: No  acute fracture. IMPRESSION: 1. Dilated gas-filled small bowel loops throughout the abdomen, not appreciably changed. 2. No contrast is noted within the colon. 3. Endoscopic capsule projects over the midline pelvis. Electronically signed by: Oneil Devonshire MD 12/28/2024 11:26 PM EST RP Workstation: [id number] Portable 1V-Small Bowel Protocol-Position Verification Result Date: 12/28/2024 CLINICAL DATA:  NG placement. EXAM: PORTABLE ABDOMEN - 1 VIEW COMPARISON:  Abdominal CT dated 12/27/2024. FINDINGS: Enteric tube with tip and side-port in the proximal stomach. Diffusely dilated air-filled loops of small bowel measure up to 4 cm. IMPRESSION: Enteric tube with tip and side-port in the proximal stomach. Electronically Signed   By: Vanetta Chou M.D.   On: 12/28/2024 15:40   CT ABDOMEN PELVIS W CONTRAST Result Date: 12/27/2024 EXAM: CT ABDOMEN AND PELVIS WITH CONTRAST 12/27/2024 05:20:58 PM TECHNIQUE: CT of the abdomen and pelvis was performed with the administration of 100 mL of iohexol  (OMNIPAQUE ) 300 MG/ML solution. Multiplanar reformatted images are provided for review. Automated exposure control, iterative reconstruction, and/or weight-based adjustment of the mA/kV was utilized to reduce the radiation dose to as low as reasonably achievable. COMPARISON: 10/29/2024 CLINICAL HISTORY: Abdominal pain, acute, nonlocalized. FINDINGS: LOWER CHEST: No acute abnormality. LIVER: The liver is unremarkable. GALLBLADDER AND BILE DUCTS: Gallbladder is unremarkable. No biliary ductal dilatation. SPLEEN: No acute  abnormality. PANCREAS: No acute abnormality. ADRENAL GLANDS: No acute abnormality. KIDNEYS, URETERS AND BLADDER: No stones in the kidneys or ureters. No hydronephrosis. No perinephric or periureteral stranding. Urinary bladder is unremarkable. GI AND BOWEL: The stomach is decompressed with a small air fluid level present. Abnormal fluid filled distention of multiple segments of small bowel predominantly in  the left abdomen measuring up to 4.2 cm in the pelvis. Abrupt narrowing in a segment of distal ileum in the right lower quadrant, where there is moderate wall thickening and mucosal enhancement, likely stricturing measuring 8.7 cm in length (axial 63-67). There is a likely endoscopic camera just downstream. Just a little bit further downstream in the ileum, there is a second region of wall thickening and mucosal enhancement with apparent narrowing measuring 5.1 cm in length (axial 58). Decompressed appendix with a small appendicolith in place. Descending and sigmoid colonic diverticulosis. No changes of acute diverticulitis. PERITONEUM AND RETROPERITONEUM: Small volume ascites, likely reactive. No free air. VASCULATURE: Aorta is normal in caliber. Diffuse aortoiliac atherosclerosis. LYMPH NODES: Couple of prominent ileocolic chain lymph nodes measuring up to 7 mm, likely reactive. REPRODUCTIVE ORGANS: Mild prostatomegaly. 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 No acute osseous abnormality. No focal soft tissue abnormality. IMPRESSION: 1. Punctate versus early high-grade small bowel obstruction due to multisegment wall thickening in the distal ileum in the right lower quadrant, possibly stricturing, suggestive of an inflammatory ileitis, such as Crohn's disease. 2. Of note, the endoscopic capsule is positioned between two thickened segments of small bowel in the ileum. Continued radiographic follow-up is recommended to document passage. 3. Small volume ascites and interloop fluid, likely reactive. 4. Descending and sigmoid colonic diverticulosis. 5. Mild prostatomegaly. Electronically signed by: Rogelia Myers MD MD 12/27/2024 06:24 PM EST RP Workstation: [id number]        Scheduled Meds:   Continuous Infusions:  heparin  1,100 Units/hr (12/29/24 0004)   lactated ringers  75 mL/hr at 12/29/24 0912     LOS: 2 days    Time spent: 40 minutes    Renato Applebaum,  MD [facility]   "

## 2024-12-29 NOTE — Plan of Care (Signed)
   Problem: Education: Goal: Knowledge of General Education information will improve Description: Including pain rating scale, medication(s)/side effects and non-pharmacologic comfort measures Outcome: Progressing   Problem: Health Behavior/Discharge Planning: Goal: Ability to manage health-related needs will improve Outcome: Progressing   Problem: Clinical Measurements: Goal: Diagnostic test results will improve Outcome: Progressing

## 2024-12-29 NOTE — Progress Notes (Signed)
 "   Progress Note  Primary GI: Dr. Wilhelmenia  LOS: 2 days   Chief Complaint: SBO, suspected Crohn's, retained capsule   Subjective   Patient reports passing flatus today.  Continue to have output via NG with 200cc in canister at the time of my evaluation. Improved abdominal distention and pain. Patient reports feeling frustrated and uncomfortable by NG tube and not being able to ambulate.  550 cc output yesterday, 300cc so far reported this shift   Objective   Vital signs in last 24 hours: Temp:  [97.8 F (36.6 C)-98.6 F (37 C)] 97.8 F (36.6 C) (01/13 1944) Pulse Rate:  [68-80] 80 (01/13 1944) Resp:  [18] 18 (01/13 1944) BP: (140-147)/(52-58) 147/58 (01/13 1944) SpO2:  [95 %-96 %] 95 % (01/13 1944)   Last BM recorded by nurses in past 5 days No data recorded  General:   male in no acute distress  Heart:  Regular rate and rhythm; no murmurs Pulm: Clear anteriorly; no wheezing Abdomen: soft, mild distention though improved from yesterday. Active bowel sounds. Generalized abdominal tenderness Extremities:  No edema Neurologic:  Alert and  oriented x4;  No focal deficits.  Psych:  Cooperative. Normal mood and affect.  Intake/Output from previous day: 01/13 0701 - 01/14 0700 In: -  Out: 550 [Emesis/NG output:550] Intake/Output this shift: Total I/O In: 30 [NG/GT:30] Out: 300 [Emesis/NG output:300]  Studies/Results: DG Abd Portable 1V-Small Bowel Obstruction Protocol-initial, 8 hr delay Result Date: 12/28/2024 EXAM: 1 VIEW XRAY OF THE ABDOMEN 12/28/2024 11:15:33 PM COMPARISON: 12/28/2024 CLINICAL HISTORY: FINDINGS: LINES, TUBES AND DEVICES: Endoscopic capsule in midline pelvis. Enteric tube in gastric fundus. BOWEL: Dilated gas-filled small bowel loops throughout the abdomen, not appreciably changed. No contrast is noted within the colon. SOFT TISSUES: Contrast is seen within the bladder from a prior CT examination. No abnormal calcifications. BONES: No acute fracture.  IMPRESSION: 1. Dilated gas-filled small bowel loops throughout the abdomen, not appreciably changed. 2. No contrast is noted within the colon. 3. Endoscopic capsule projects over the midline pelvis. Electronically signed by: Oneil Devonshire MD 12/28/2024 11:26 PM EST RP Workstation: [email or web address] [email or web address] Abd Portable 1V-Small Bowel Protocol-Position Verification Result Date: 12/28/2024 CLINICAL DATA:  NG placement. EXAM: PORTABLE ABDOMEN - 1 VIEW COMPARISON:  Abdominal CT dated 12/27/2024. FINDINGS: Enteric tube with tip and side-port in the proximal stomach. Diffusely dilated air-filled loops of small bowel measure up to 4 cm. IMPRESSION: Enteric tube with tip and side-port in the proximal stomach. Electronically Signed   By: Vanetta Chou M.D.   On: 12/28/2024 15:40   CT ABDOMEN PELVIS W CONTRAST Result Date: 12/27/2024 EXAM: CT ABDOMEN AND PELVIS WITH CONTRAST 12/27/2024 05:20:58 PM TECHNIQUE: CT of the abdomen and pelvis was performed with the administration of 100 mL of iohexol  (OMNIPAQUE ) 300 MG/ML solution. Multiplanar reformatted images are provided for review. Automated exposure control, iterative reconstruction, and/or weight-based adjustment of the mA/kV was utilized to reduce the radiation dose to as low as reasonably achievable. COMPARISON: 10/29/2024 CLINICAL HISTORY: Abdominal pain, acute, nonlocalized. FINDINGS: LOWER CHEST: No acute abnormality. LIVER: The liver is unremarkable. GALLBLADDER AND BILE DUCTS: Gallbladder is unremarkable. No biliary ductal dilatation. SPLEEN: No acute abnormality. PANCREAS: No acute abnormality. ADRENAL GLANDS: No acute abnormality. KIDNEYS, URETERS AND BLADDER: No stones in the kidneys or ureters. No hydronephrosis. No perinephric or periureteral stranding. Urinary bladder is unremarkable. GI AND BOWEL: The stomach is decompressed with a small air fluid level present. Abnormal fluid filled distention of multiple segments of small  bowel predominantly in the left abdomen  measuring up to 4.2 cm in the pelvis. Abrupt narrowing in a segment of distal ileum in the right lower quadrant, where there is moderate wall thickening and mucosal enhancement, likely stricturing measuring 8.7 cm in length (axial 63-67). There is a likely endoscopic camera just downstream. Just a little bit further downstream in the ileum, there is a second region of wall thickening and mucosal enhancement with apparent narrowing measuring 5.1 cm in length (axial 58). Decompressed appendix with a small appendicolith in place. Descending and sigmoid colonic diverticulosis. No changes of acute diverticulitis. PERITONEUM AND RETROPERITONEUM: Small volume ascites, likely reactive. No free air. VASCULATURE: Aorta is normal in caliber. Diffuse aortoiliac atherosclerosis. LYMPH NODES: Couple of prominent ileocolic chain lymph nodes measuring up to 7 mm, likely reactive. REPRODUCTIVE ORGANS: Mild prostatomegaly. 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 No acute osseous abnormality. No focal soft tissue abnormality. IMPRESSION: 1. Punctate versus early high-grade small bowel obstruction due to multisegment wall thickening in the distal ileum in the right lower quadrant, possibly stricturing, suggestive of an inflammatory ileitis, such as Crohn's disease. 2. Of note, the endoscopic capsule is positioned between two thickened segments of small bowel in the ileum. Continued radiographic follow-up is recommended to document passage. 3. Small volume ascites and interloop fluid, likely reactive. 4. Descending and sigmoid colonic diverticulosis. 5. Mild prostatomegaly. Electronically signed by: Rogelia Myers MD MD 12/27/2024 06:24 PM EST RP Workstation: [id number]    Lab Results: Recent Labs    12/27/24 1430 12/28/24 0105 12/29/24 0449  WBC 13.0* 10.9* 9.2  HGB 13.2 12.1* 11.7*  HCT 37.8* 35.8* 34.3*  PLT 151 125* 113*   BMET Recent Labs    12/27/24 1430 12/28/24 0105  12/29/24 0449  NA 136 135 140  K 5.1 4.9 4.8  CL 103 106 107  CO2 20* 20* 18*  GLUCOSE 133* 126* 108*  BUN 30* 32* 31*  CREATININE 1.63* 1.64* 1.55*  CALCIUM 9.7 9.1 9.2   LFT Recent Labs    12/28/24 0105  PROT 6.3*  ALBUMIN  4.0  AST 18  ALT 13  ALKPHOS 103  BILITOT 0.8   PT/INR No results for input(s): LABPROT, INR in the last 72 hours.   Scheduled Meds: Continuous Infusions:  heparin  1,100 Units/hr (12/29/24 0004)      Patient profile:    79 y.o. male with past medical history significant for atrial flutter, gout, hypertension, hyperlipidemia, and chronic diarrhea presents for evaluation of small bowel obstruction and retained capsule endoscopy.  Workup in November 2025 showed CT scans with small bowel inflammation, elevated fecal calprotectin, with negative EGD/colonoscopy.  VCE placed 1/9 with development of obstructive symptoms within 24 hours  CTAP 1/12 with SBO and inflammatory ileitis characterized by by narrowing segment in distal ileum in the RLQ with moderate wall thickening and mucosal enhancement felt to be a stricturing with capsule endoscopy noted to be downstream from this with second regional wall thickening and mucosal enhancement and apparent narrowing distal to this as well.  No free air   Impression:   Small bowel obstruction in the setting of suspected stricturing Crohn's disease with retained capsule  850 NG tube output over last 24hrs per documentation WBC 9.2 (10.9) Patient appears to be clinically improving with adequate NG output which is reassuring. Hopefully he will continue to improve on conservative management. However, if obstruction cannot be relieved will ultimately need surgical resection.  - Monitor electrolytes,  K >=4,  Phos >= 3, Mg >= 2  -- Dr. Albertus to decide if he would benefit from IV methylprednisolone   -- continue to monitor NG tube output   Chiyo Fay CHRISTELLA Blower  12/29/2024, 9:11 AM    "

## 2024-12-29 NOTE — Progress Notes (Signed)
 "      Subjective: Doesn't like the NGT.  Having phlegm production secondary to this tube.  Passing some flatus, but no BM.  Abdomen feels softer today.  Pain is less overall  ROS: See above, otherwise other systems negative  Objective: Vital signs in last 24 hours: Temp:  [97.8 F (36.6 C)-98.6 F (37 C)] 97.8 F (36.6 C) (01/13 1944) Pulse Rate:  [68-80] 80 (01/13 1944) Resp:  [18] 18 (01/13 1944) BP: (140-147)/(52-58) 147/58 (01/13 1944) SpO2:  [95 %-96 %] 95 % (01/13 1944)    Intake/Output from previous day: 01/13 0701 - 01/14 0700 In: -  Out: 550 [Emesis/NG output:550] Intake/Output this shift: Total I/O In: 30 [NG/GT:30] Out: 300 [Emesis/NG output:300]  PE: Gen: NAD Abd: soft, mild distention, NGT in place with about 300cc of brown/bilious output currently, 550cc documented yesterday, mild tenderness in RLQ  Lab Results:  Recent Labs    12/28/24 0105 12/29/24 0449  WBC 10.9* 9.2  HGB 12.1* 11.7*  HCT 35.8* 34.3*  PLT 125* 113*   BMET Recent Labs    12/28/24 0105 12/29/24 0449  NA 135 140  K 4.9 4.8  CL 106 107  CO2 20* 18*  GLUCOSE 126* 108*  BUN 32* 31*  CREATININE 1.64* 1.55*  CALCIUM 9.1 9.2   PT/INR No results for input(s): LABPROT, INR in the last 72 hours. CMP     Component Value Date/Time   NA 140 12/29/2024 0449   K 4.8 12/29/2024 0449   CL 107 12/29/2024 0449   CO2 18 (L) 12/29/2024 0449   GLUCOSE 108 (H) 12/29/2024 0449   BUN 31 (H) 12/29/2024 0449   CREATININE 1.55 (H) 12/29/2024 0449   CALCIUM 9.2 12/29/2024 0449   PROT 6.3 (L) 12/28/2024 0105   ALBUMIN  4.0 12/28/2024 0105   AST 18 12/28/2024 0105   ALT 13 12/28/2024 0105   ALKPHOS 103 12/28/2024 0105   BILITOT 0.8 12/28/2024 0105   GFRNONAA 46 (L) 12/29/2024 0449   GFRAA >60 06/29/2016 1838   Lipase     Component Value Date/Time   LIPASE 22 12/27/2024 1430       Studies/Results: DG Abd Portable 1V-Small Bowel Obstruction Protocol-initial, 8 hr  delay Result Date: 12/28/2024 EXAM: 1 VIEW XRAY OF THE ABDOMEN 12/28/2024 11:15:33 PM COMPARISON: 12/28/2024 CLINICAL HISTORY: FINDINGS: LINES, TUBES AND DEVICES: Endoscopic capsule in midline pelvis. Enteric tube in gastric fundus. BOWEL: Dilated gas-filled small bowel loops throughout the abdomen, not appreciably changed. No contrast is noted within the colon. SOFT TISSUES: Contrast is seen within the bladder from a prior CT examination. No abnormal calcifications. BONES: No acute fracture. IMPRESSION: 1. Dilated gas-filled small bowel loops throughout the abdomen, not appreciably changed. 2. No contrast is noted within the colon. 3. Endoscopic capsule projects over the midline pelvis. Electronically signed by: Oneil Devonshire MD 12/28/2024 11:26 PM EST RP Workstation: [email or web address] [email or web address] Abd Portable 1V-Small Bowel Protocol-Position Verification Result Date: 12/28/2024 CLINICAL DATA:  NG placement. EXAM: PORTABLE ABDOMEN - 1 VIEW COMPARISON:  Abdominal CT dated 12/27/2024. FINDINGS: Enteric tube with tip and side-port in the proximal stomach. Diffusely dilated air-filled loops of small bowel measure up to 4 cm. IMPRESSION: Enteric tube with tip and side-port in the proximal stomach. Electronically Signed   By: Vanetta Chou M.D.   On: 12/28/2024 15:40   CT ABDOMEN PELVIS W CONTRAST Result Date: 12/27/2024 EXAM: CT ABDOMEN AND PELVIS WITH CONTRAST 12/27/2024 05:20:58 PM TECHNIQUE: CT of the abdomen and pelvis was performed  with the administration of 100 mL of iohexol  (OMNIPAQUE ) 300 MG/ML solution. Multiplanar reformatted images are provided for review. Automated exposure control, iterative reconstruction, and/or weight-based adjustment of the mA/kV was utilized to reduce the radiation dose to as low as reasonably achievable. COMPARISON: 10/29/2024 CLINICAL HISTORY: Abdominal pain, acute, nonlocalized. FINDINGS: LOWER CHEST: No acute abnormality. LIVER: The liver is unremarkable. GALLBLADDER AND BILE DUCTS:  Gallbladder is unremarkable. No biliary ductal dilatation. SPLEEN: No acute abnormality. PANCREAS: No acute abnormality. ADRENAL GLANDS: No acute abnormality. KIDNEYS, URETERS AND BLADDER: No stones in the kidneys or ureters. No hydronephrosis. No perinephric or periureteral stranding. Urinary bladder is unremarkable. GI AND BOWEL: The stomach is decompressed with a small air fluid level present. Abnormal fluid filled distention of multiple segments of small bowel predominantly in the left abdomen measuring up to 4.2 cm in the pelvis. Abrupt narrowing in a segment of distal ileum in the right lower quadrant, where there is moderate wall thickening and mucosal enhancement, likely stricturing measuring 8.7 cm in length (axial 63-67). There is a likely endoscopic camera just downstream. Just a little bit further downstream in the ileum, there is a second region of wall thickening and mucosal enhancement with apparent narrowing measuring 5.1 cm in length (axial 58). Decompressed appendix with a small appendicolith in place. Descending and sigmoid colonic diverticulosis. No changes of acute diverticulitis. PERITONEUM AND RETROPERITONEUM: Small volume ascites, likely reactive. No free air. VASCULATURE: Aorta is normal in caliber. Diffuse aortoiliac atherosclerosis. LYMPH NODES: Couple of prominent ileocolic chain lymph nodes measuring up to 7 mm, likely reactive. REPRODUCTIVE ORGANS: Mild prostatomegaly. 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 No acute osseous abnormality. No focal soft tissue abnormality. IMPRESSION: 1. Punctate versus early high-grade small bowel obstruction due to multisegment wall thickening in the distal ileum in the right lower quadrant, possibly stricturing, suggestive of an inflammatory ileitis, such as Crohn's disease. 2. Of note, the endoscopic capsule is positioned between two thickened segments of small bowel in the ileum. Continued radiographic  follow-up is recommended to document passage. 3. Small volume ascites and interloop fluid, likely reactive. 4. Descending and sigmoid colonic diverticulosis. 5. Mild prostatomegaly. Electronically signed by: Rogelia Myers MD MD 12/27/2024 06:24 PM EST RP Workstation: [id number]    Anti-infectives: Anti-infectives (From admission, onward)    None        Assessment/Plan SBO secondary to presumed Crohn's disease -no steroids have been started yet per GI -8-hr delay film shows persistent SBO, but some air in the colon and passing clinically passing flatus.  No BM -will recheck plain film in the am.  If no improvement then will discuss possible surgical intervention at that time  -cont NGT for today -IS, pulm toilet, mobilization, may clamp NGT to mobilize -labs all reviewed. WBC normal.  Cr stable at 1.55  -saw in conjunction with primary service  FEN - NPO/NGT/IVFs VTE - heparin  ID - none currently needed  I reviewed Consultant GI notes, hospitalist notes, last 24 h vitals and pain scores, last 48 h intake and output, last 24 h labs and trends, and last 24 h imaging results.   LOS: 2 days    Burnard FORBES Banter , [facility] 12/29/2024, 10:21 AM Please see Amion for pager number during day hours 7:00am-4:30pm or 7:00am -11:30am on weekends  "

## 2024-12-29 NOTE — Progress Notes (Signed)
 PHARMACY - ANTICOAGULATION CONSULT NOTE  Pharmacy Consult for Heparin  Indication: atrial fibrillation  Allergies[1]  Patient Measurements:    Vital Signs:    Labs: Recent Labs    12/27/24 1430 12/28/24 0105 12/28/24 1303 12/29/24 0449  HGB 13.2 12.1*  --  11.7*  HCT 37.8* 35.8*  --  34.3*  PLT 151 125*  --  113*  APTT  --   --  72* 105*  HEPARINUNFRC  --   --  >1.10* >1.10*  CREATININE 1.63* 1.64*  --  1.55*    CrCl cannot be calculated (Unknown ideal weight.).   Medications:  Infusions:   heparin  1,100 Units/hr (12/29/24 0004)    Assessment: 79 yo M admitted with abdominal pain.  Abdominal CT + for possible bowel obstruction v. Inflammatory ileitis.   He is on apixaban  PTA for Afib (last dose morning of 12/27/24) which is being held pending surgical consult.  Pharmacy consulted to dose IV heparin .  Baseline  labs: Hg & pltc WNL, Scr elevated from baseline at 1.63, FOBT negative.  Today, 12/29/2024: Heparin  level >1.10--supratherapeutic as expected given recent DOAC use (DOACs can falsely elevate heparin  levels) aPTT 105 secs--slightly supratherapeutic Clarified with RN and aPTT was drawn from the same arm that heparin  is running (can result in a falsely elevated level). Ordering a repeat aPTT to confirm, will need to be drawn from opposite arm of heparin  infusion. Repeat aPTT 97 secs - therapeutic on heparin  1100 units/hr  Hgb 11.7--down-trending but stable (baseline seems to be 12s) Plts 113--low/down-trending (baseline seems to be low 100s) No s/sx of bleeding reported   Goal of Therapy:  Heparin  level 0.3-0.7 units/ml aPTT 66-102 seconds Monitor platelets by anticoagulation protocol: Yes   Plan:  Continue IV heparin  at 1100 units/hr Daily heparin  level & CBC while on heparin ; aPTT as needed while DOAC effects remain Monitor closely for s/sx of bleeding F/U surgery recommendations/ability to resume apixaban --right now, hopeful for conservative  management    Lacinda Moats, PharmD Clinical Pharmacist  1/14/20268:52 AM    [1]  Allergies Allergen Reactions   Novocain [Procaine] Hypertension   Sulfa Antibiotics Other (See Comments)    shaky   Protonix  [Pantoprazole ] Other (See Comments)    Reports dizziness    Vardenafil Hcl Palpitations

## 2024-12-29 NOTE — Progress Notes (Signed)
" ° ° °  PROCEDURAL EXPEDITER PROGRESS NOTE  Patient Name: Timothy Peterson  DOB:[birth year] Date of Admission: 12/27/2024  Date of Assessment:12/29/2024   -------------------------------------------------------------------------------------------------------------------   Brief clinical summary: 79 yr old male  with Hx of Alflutter, gout HTN, hyperlipidemia and came to ED for adb pain.   Pt having surgery 12/30/24,:ap partial colectomy  Orders in place: Yes    Communication with surgical team if no orders: n/a  Labs, test, and orders reviewed: yes  Requires surgical clearance:  No  What type of clearance: n/a  Clearance received: n/a  Barriers noted: heparin  drip to be turned off at midnight.    Intervention provided by [facility] team: n/a  Barrier resolved:  no   -------------------------------------------------------------------------------------------------------------------  Marathon [employer], Ronal DELENA Bald Please contact us  directly via secure chat (search for [facility]) or by calling us  at [phone] [facility]).  "

## 2024-12-29 NOTE — Progress Notes (Addendum)
 Discussed with attending at sign out who recommended OR later today vs tomorrow based on patients current clinical picture. Patient seen at bedside.  He reports his abdomen is feeling less distended since NGT placement. Passing some flatus.  Abdomen is softer today, mild distension, with mild ttp in the lower abdomen that is improved from yesterday.   Recommend OR for diagnostic laparoscopy, possible exploratory laparotomy, bowel resection. The planned procedure and material risks were discussed with the patient. Risks include but are not limited to anesthesia (MI, CVA, prolonged intubation, aspiration, death), pain, bleeding,  infection, scarring, hernia, damage to surrounding structures (blood vessels/nerves/viscus/organs/ureter), ileus, leak from anastomosis, DVT/PE and possible need for stoma/ileostomy. We also discussed typical post-operative care including the possible need for rehab/snf if necessary. The patient's questions were answered to their satisfaction, they voiced understanding and elected to proceed with surgery.   Patient is currently on heparin  gtt. Discussed with attending and will plan for OR tomorrow with heparin  held at midnight. Xray has already been ordered for the AM. Will add on pre-albumin . Albumin  was 4 on yesterdays labs. WOCN consulted for marking incase need for ostomy. All questions answered. RN updated.   Ozell CHRISTELLA Shaper , [facility] 12/29/2024, 1:39 PM Please see Amion for pager number during day hours 7:00am-4:30pm

## 2024-12-29 NOTE — H&P (View-Only) (Signed)
 Discussed with attending at sign out who recommended OR later today vs tomorrow based on patients current clinical picture. Patient seen at bedside.  He reports his abdomen is feeling less distended since NGT placement. Passing some flatus.  Abdomen is softer today, mild distension, with mild ttp in the lower abdomen that is improved from yesterday.   Recommend OR for diagnostic laparoscopy, possible exploratory laparotomy, bowel resection. The planned procedure and material risks were discussed with the patient. Risks include but are not limited to anesthesia (MI, CVA, prolonged intubation, aspiration, death), pain, bleeding,  infection, scarring, hernia, damage to surrounding structures (blood vessels/nerves/viscus/organs/ureter), ileus, leak from anastomosis, DVT/PE and possible need for stoma/ileostomy. We also discussed typical post-operative care including the possible need for rehab/snf if necessary. The patient's questions were answered to their satisfaction, they voiced understanding and elected to proceed with surgery.   Patient is currently on heparin  gtt. Discussed with attending and will plan for OR tomorrow with heparin  held at midnight. Xray has already been ordered for the AM. Will add on pre-albumin . Albumin  was 4 on yesterdays labs. WOCN consulted for marking incase need for ostomy. All questions answered. RN updated.   Timothy Peterson , [facility] 12/29/2024, 1:39 PM Please see Amion for pager number during day hours 7:00am-4:30pm

## 2024-12-29 NOTE — Plan of Care (Signed)

## 2024-12-30 ENCOUNTER — Encounter (HOSPITAL_COMMUNITY): Payer: Self-pay | Admitting: Internal Medicine

## 2024-12-30 ENCOUNTER — Encounter (HOSPITAL_COMMUNITY)
Admission: EM | Disposition: A | Discharge status: Home / Self Care | Payer: Self-pay | Source: Home / Self Care | Attending: Internal Medicine

## 2024-12-30 ENCOUNTER — Inpatient Hospital Stay (HOSPITAL_COMMUNITY): Admitting: Anesthesiology

## 2024-12-30 ENCOUNTER — Inpatient Hospital Stay (HOSPITAL_COMMUNITY)

## 2024-12-30 DIAGNOSIS — K420 Umbilical hernia with obstruction, without gangrene: Secondary | ICD-10-CM

## 2024-12-30 DIAGNOSIS — K529 Noninfective gastroenteritis and colitis, unspecified: Secondary | ICD-10-CM

## 2024-12-30 DIAGNOSIS — K56609 Unspecified intestinal obstruction, unspecified as to partial versus complete obstruction: Secondary | ICD-10-CM | POA: Diagnosis not present

## 2024-12-30 DIAGNOSIS — K56699 Other intestinal obstruction unspecified as to partial versus complete obstruction: Secondary | ICD-10-CM | POA: Diagnosis present

## 2024-12-30 DIAGNOSIS — T183XXA Foreign body in small intestine, initial encounter: Secondary | ICD-10-CM | POA: Diagnosis not present

## 2024-12-30 DIAGNOSIS — I10 Essential (primary) hypertension: Secondary | ICD-10-CM

## 2024-12-30 HISTORY — PX: UMBILICAL HERNIA REPAIR: SHX196

## 2024-12-30 HISTORY — PX: LAPAROSCOPIC PARTIAL COLECTOMY: SHX5907

## 2024-12-30 LAB — CBC
HCT: 32.6 % — ABNORMAL LOW (ref 39.0–52.0)
Hemoglobin: 11 g/dL — ABNORMAL LOW (ref 13.0–17.0)
MCH: 34.2 pg — ABNORMAL HIGH (ref 26.0–34.0)
MCHC: 33.7 g/dL (ref 30.0–36.0)
MCV: 101.2 fL — ABNORMAL HIGH (ref 80.0–100.0)
Platelets: 101 K/uL — ABNORMAL LOW (ref 150–400)
RBC: 3.22 MIL/uL — ABNORMAL LOW (ref 4.22–5.81)
RDW: 14 % (ref 11.5–15.5)
WBC: 8.6 K/uL (ref 4.0–10.5)
nRBC: 0 % (ref 0.0–0.2)

## 2024-12-30 LAB — TYPE AND SCREEN
ABO/RH(D): O NEG
Antibody Screen: NEGATIVE

## 2024-12-30 LAB — ABO/RH: ABO/RH(D): O NEG

## 2024-12-30 MED ORDER — SODIUM CHLORIDE 0.9 % IV SOLN
2.0000 g | Freq: Two times a day (BID) | INTRAVENOUS | Status: AC
  Administered 2024-12-30: 2 g via INTRAVENOUS
  Filled 2024-12-30: qty 2

## 2024-12-30 MED ORDER — SUCCINYLCHOLINE CHLORIDE 200 MG/10ML IV SOSY
PREFILLED_SYRINGE | INTRAVENOUS | Status: AC
  Filled 2024-12-30: qty 10

## 2024-12-30 MED ORDER — SUGAMMADEX SODIUM 200 MG/2ML IV SOLN
INTRAVENOUS | Status: DC | PRN
  Administered 2024-12-30: 200 mg via INTRAVENOUS

## 2024-12-30 MED ORDER — HYDROMORPHONE HCL 1 MG/ML IJ SOLN
0.5000 mg | INTRAMUSCULAR | Status: DC | PRN
  Administered 2024-12-30: 2 mg via INTRAVENOUS
  Administered 2024-12-30 – 2024-12-31 (×3): 1 mg via INTRAVENOUS
  Filled 2024-12-30 (×3): qty 1
  Filled 2024-12-30: qty 2

## 2024-12-30 MED ORDER — SODIUM CHLORIDE 0.9 % IV SOLN: Freq: Three times a day (TID) | INTRAVENOUS | Status: DC | PRN

## 2024-12-30 MED ORDER — ALBUMIN HUMAN 5 % IV SOLN
INTRAVENOUS | Status: AC
  Filled 2024-12-30: qty 250

## 2024-12-30 MED ORDER — FENTANYL CITRATE (PF) 100 MCG/2ML IJ SOLN
INTRAMUSCULAR | Status: DC | PRN
  Administered 2024-12-30: 50 ug via INTRAVENOUS
  Administered 2024-12-30: 100 ug via INTRAVENOUS
  Administered 2024-12-30: 50 ug via INTRAVENOUS

## 2024-12-30 MED ORDER — SUCCINYLCHOLINE CHLORIDE 200 MG/10ML IV SOSY
PREFILLED_SYRINGE | INTRAVENOUS | Status: DC | PRN
  Administered 2024-12-30: 120 mg via INTRAVENOUS

## 2024-12-30 MED ORDER — LIDOCAINE HCL (PF) 2 % IJ SOLN
INTRAMUSCULAR | Status: AC
  Filled 2024-12-30: qty 5

## 2024-12-30 MED ORDER — STERILE WATER FOR IRRIGATION IR SOLN
Status: DC | PRN
  Administered 2024-12-30: 1000 mL

## 2024-12-30 MED ORDER — ENSURE SURGERY PO LIQD
237.0000 mL | Freq: Two times a day (BID) | ORAL | Status: DC
  Filled 2024-12-30 (×3): qty 237

## 2024-12-30 MED ORDER — FENTANYL CITRATE (PF) 50 MCG/ML IJ SOSY
PREFILLED_SYRINGE | INTRAMUSCULAR | Status: AC
  Filled 2024-12-30: qty 2

## 2024-12-30 MED ORDER — METOPROLOL TARTRATE 5 MG/5ML IV SOLN: 5.0000 mg | Freq: Four times a day (QID) | INTRAVENOUS | Status: DC | PRN

## 2024-12-30 MED ORDER — BUPIVACAINE-EPINEPHRINE (PF) 0.25% -1:200000 IJ SOLN
INTRAMUSCULAR | Status: AC
  Filled 2024-12-30: qty 60

## 2024-12-30 MED ORDER — LACTATED RINGERS IV SOLN: INTRAVENOUS | Status: DC | PRN

## 2024-12-30 MED ORDER — PROPOFOL 10 MG/ML IV BOLUS
INTRAVENOUS | Status: AC
  Filled 2024-12-30: qty 20

## 2024-12-30 MED ORDER — BISACODYL 10 MG RE SUPP
10.0000 mg | Freq: Every day | RECTAL | Status: DC
  Administered 2024-12-31 – 2025-01-01 (×2): 10 mg via RECTAL
  Filled 2024-12-30 (×3): qty 1

## 2024-12-30 MED ORDER — ROCURONIUM BROMIDE 10 MG/ML (PF) SYRINGE
PREFILLED_SYRINGE | INTRAVENOUS | Status: AC
  Filled 2024-12-30: qty 10

## 2024-12-30 MED ORDER — KCL IN DEXTROSE-NACL 20-5-0.45 MEQ/L-%-% IV SOLN
INTRAVENOUS | Status: AC
  Filled 2024-12-30: qty 1000

## 2024-12-30 MED ORDER — ENOXAPARIN SODIUM 40 MG/0.4ML IJ SOSY
40.0000 mg | PREFILLED_SYRINGE | INTRAMUSCULAR | Status: DC
  Administered 2024-12-31 – 2025-01-05 (×6): 40 mg via SUBCUTANEOUS
  Filled 2024-12-30 (×6): qty 0.4

## 2024-12-30 MED ORDER — METHOCARBAMOL 1000 MG/10ML IJ SOLN
1000.0000 mg | Freq: Four times a day (QID) | INTRAMUSCULAR | Status: DC | PRN
  Administered 2024-12-30 – 2024-12-31 (×2): 1000 mg via INTRAVENOUS
  Filled 2024-12-30 (×2): qty 10

## 2024-12-30 MED ORDER — SIMETHICONE 80 MG PO CHEW: 40.0000 mg | CHEWABLE_TABLET | Freq: Four times a day (QID) | ORAL | Status: DC | PRN

## 2024-12-30 MED ORDER — LACTATED RINGERS IR SOLN
Status: DC | PRN
  Administered 2024-12-30: 1000 mL

## 2024-12-30 MED ORDER — MENTHOL 3 MG MT LOZG: 1.0000 | LOZENGE | OROMUCOSAL | Status: DC | PRN

## 2024-12-30 MED ORDER — FENTANYL CITRATE (PF) 100 MCG/2ML IJ SOLN
INTRAMUSCULAR | Status: AC
  Filled 2024-12-30: qty 2

## 2024-12-30 MED ORDER — MAGIC MOUTHWASH: 15.0000 mL | Freq: Four times a day (QID) | ORAL | Status: DC | PRN

## 2024-12-30 MED ORDER — SODIUM CHLORIDE 0.9 % IR SOLN
Status: DC | PRN
  Administered 2024-12-30 (×2): 1000 mL

## 2024-12-30 MED ORDER — FENTANYL CITRATE (PF) 50 MCG/ML IJ SOSY
25.0000 ug | PREFILLED_SYRINGE | INTRAMUSCULAR | Status: DC | PRN
  Administered 2024-12-30 (×2): 50 ug via INTRAVENOUS

## 2024-12-30 MED ORDER — NAPHAZOLINE-GLYCERIN 0.012-0.25 % OP SOLN: 1.0000 [drp] | Freq: Four times a day (QID) | OPHTHALMIC | Status: DC | PRN

## 2024-12-30 MED ORDER — DROPERIDOL 2.5 MG/ML IJ SOLN: 0.6250 mg | Freq: Once | INTRAMUSCULAR | Status: DC | PRN

## 2024-12-30 MED ORDER — ONDANSETRON HCL 4 MG/2ML IJ SOLN
INTRAMUSCULAR | Status: DC | PRN
  Administered 2024-12-30: 4 mg via INTRAVENOUS

## 2024-12-30 MED ORDER — ALBUMIN HUMAN 5 % IV SOLN: INTRAVENOUS | Status: DC | PRN

## 2024-12-30 MED ORDER — BUPIVACAINE-EPINEPHRINE 0.25% -1:200000 IJ SOLN
INTRAMUSCULAR | Status: DC | PRN
  Administered 2024-12-30: 45 mL
  Administered 2024-12-30: 15 mL

## 2024-12-30 MED ORDER — LIDOCAINE HCL (CARDIAC) PF 100 MG/5ML IV SOSY
PREFILLED_SYRINGE | INTRAVENOUS | Status: DC | PRN
  Administered 2024-12-30: 60 mg via INTRAVENOUS

## 2024-12-30 MED ORDER — ROCURONIUM BROMIDE 100 MG/10ML IV SOLN
INTRAVENOUS | Status: DC | PRN
  Administered 2024-12-30: 10 mg via INTRAVENOUS
  Administered 2024-12-30: 20 mg via INTRAVENOUS
  Administered 2024-12-30: 50 mg via INTRAVENOUS

## 2024-12-30 MED ORDER — LACTATED RINGERS IV BOLUS: 1000.0000 mL | Freq: Three times a day (TID) | INTRAVENOUS | Status: DC | PRN

## 2024-12-30 MED ORDER — PROPOFOL 10 MG/ML IV BOLUS
INTRAVENOUS | Status: DC | PRN
  Administered 2024-12-30: 150 mg via INTRAVENOUS

## 2024-12-30 MED ORDER — SALINE SPRAY 0.65 % NA SOLN: 1.0000 | Freq: Four times a day (QID) | NASAL | Status: DC | PRN

## 2024-12-30 NOTE — Transfer of Care (Signed)
 Immediate Anesthesia Transfer of Care Note  Patient: Timothy Peterson  Procedure(s) Performed: PARTIAL COLECTOMY UMBILICAL HERNIA REPAIR, PARTIAL (Abdomen)  Patient Location: PACU  Anesthesia Type:General  Level of Consciousness: awake, alert , and oriented  Airway & Oxygen Therapy: Patient Spontanous Breathing and Patient connected to face mask oxygen  Post-op Assessment: Report given to RN and Post -op Vital signs reviewed and stable  Post vital signs: Reviewed and stable  Last Vitals:  Vitals Value Taken Time  BP 153/62 12/30/24 10:30  Temp    Pulse 83 12/30/24 10:32  Resp 17 12/30/24 10:32  SpO2 96 % 12/30/24 10:32  Vitals shown include unfiled device data.  Last Pain:  Vitals:   12/30/24 0700  TempSrc:   PainSc: 0-No pain         Complications: No notable events documented.

## 2024-12-30 NOTE — Anesthesia Preprocedure Evaluation (Signed)
"                                    Anesthesia Evaluation  Patient identified by MRN, date of birth, ID band Patient awake    Reviewed: Allergy & Precautions, NPO status , Patient's Chart, lab work & pertinent test results  Airway Mallampati: II  TM Distance: >3 FB Neck ROM: Full    Dental   Pulmonary neg pulmonary ROS   + rhonchi        Cardiovascular hypertension, Pt. on medications + dysrhythmias Atrial Fibrillation  Rhythm:Regular Rate:Normal     Neuro/Psych negative neurological ROS     GI/Hepatic Neg liver ROS,,,Bowel obstruction   Endo/Other  negative endocrine ROS    Renal/GU Renal InsufficiencyRenal disease     Musculoskeletal  (+) Arthritis ,    Abdominal   Peds  Hematology  (+) Blood dyscrasia (Thrombocytopenia), anemia   Anesthesia Other Findings   Reproductive/Obstetrics                              Anesthesia Physical Anesthesia Plan  ASA: 3  Anesthesia Plan: General   Post-op Pain Management: Ofirmev  IV (intra-op)*   Induction: Intravenous and Rapid sequence  PONV Risk Score and Plan: 3 and Ondansetron , Dexamethasone, Treatment may vary due to age or medical condition and Diphenhydramine  Airway Management Planned: Oral ETT  Additional Equipment: None  Intra-op Plan:   Post-operative Plan: Extubation in OR  Informed Consent: I have reviewed the patients History and Physical, chart, labs and discussed the procedure including the risks, benefits and alternatives for the proposed anesthesia with the patient or authorized representative who has indicated his/her understanding and acceptance.     Dental advisory given  Plan Discussed with:   Anesthesia Plan Comments:         Anesthesia Quick Evaluation  "

## 2024-12-30 NOTE — Consult Note (Addendum)
 WOC Nurse ostomy follow up Pt had surgery performed today and did not receive an ostomy.  Surgical team following for assessment and plan of care.  No further role for WOC team.  Please re-consult if further assistance is needed.  Thank-you,  Stephane Fought MSN, RN, CWOCN, CWCN-AP, CNS Contact Mon-Fri 0700-1500: [phone]

## 2024-12-30 NOTE — Plan of Care (Signed)
   Problem: Education: Goal: Knowledge of General Education information will improve Description: Including pain rating scale, medication(s)/side effects and non-pharmacologic comfort measures Outcome: Progressing   Problem: Clinical Measurements: Goal: Will remain free from infection Outcome: Progressing

## 2024-12-30 NOTE — Interval H&P Note (Signed)
 History and Physical Interval Note:  12/30/2024 10:19 AM  Timothy Peterson  has presented today for surgery, with the diagnosis of ILEO STRICTURE.  The various methods of treatment have been discussed with the patient and family. After consideration of risks, benefits and other options for treatment, the patient has consented to  Procedures: PARTIAL COLECTOMY (N/A) (N/A) as a surgical intervention.  The patient's history has been reviewed, patient examined, no change in status, stable for surgery.  I have reviewed the patient's chart and labs.  Questions were answered to the patient's satisfaction.    The anatomy & physiology of the digestive tract was discussed.  The pathophysiology of intestinal obstruction was discussed.  Natural history risks without surgery was discussed.   I feel the patient has failed non-operative therapies.  The risks of no intervention will lead to serious problems such as necrosis, perforation, dehydration, etc. that outweigh the operative risks; therefore, I recommended abdominal exploration to diagnose & treat the source of the problem.  Minimally Invasive & open techniques were discussed.   I expressed a good likelihood that surgery will treat the problem.  Risks such as bleeding, infection, abscess, leak, reoperation, bowel resection, possible ostomy, hernia, injury to other organs, need for repair of tissues / organs, need for further treatment, heart attack, death, and other risks were discussed.   I noted a good likelihood this will help address the problem.  Goals of post-operative recovery were discussed as well.  We will work to minimize complications. Questions were answered.  The patient expresses understanding & wishes to proceed with surgery.   [name]

## 2024-12-30 NOTE — Anesthesia Procedure Notes (Signed)
 Procedure Name: Intubation Date/Time: 12/30/2024 8:00 AM  Performed by: Muadh Creasy, Corean BROCKS, CRNAPre-anesthesia Checklist: Patient identified, Emergency Drugs available, Suction available and Patient being monitored Patient Re-evaluated:Patient Re-evaluated prior to induction Oxygen Delivery Method: Circle system utilized Preoxygenation: Pre-oxygenation with 100% oxygen Induction Type: IV induction, Rapid sequence and Cricoid Pressure applied Laryngoscope Size: Mac and 4 Grade View: Grade I Tube type: Oral Number of attempts: 1 Airway Equipment and Method: Stylet and Oral airway Placement Confirmation: ETT inserted through vocal cords under direct vision, positive ETCO2 and breath sounds checked- equal and bilateral Secured at: 22 cm Tube secured with: Tape Dental Injury: Teeth and Oropharynx as per pre-operative assessment

## 2024-12-30 NOTE — Progress Notes (Signed)
 " PROGRESS NOTE    Lev Cervone  FMW:[id number] DOB: 12-15-1946 DOA: 12/27/2024 PCP: [facility]    Brief Narrative:  79 year old with history of hypertension, hyperlipidemia, A-flutter on Eliquis  who has been followed by gastroenterology for the last several months with history of diarrhea and enteritis.  Patient also reported 20 pound weight loss last 5 years.  Recent EGD and colonoscopy, capsule endoscopy on 1/9.  After about 24 hours of capsule endoscopy patient started having lower abdominal discomfort with nausea hiccups and burping.  In the emergency room hemodynamically stable.  Complaining of abdominal pain relieved with morphine .  Creatinine 1.6 with recent creatinine of 1.1.  CT scan abdomen pelvis with high-grade small bowel obstruction, capsule endoscopy still at the ileum.  Admitted with GI and surgery consultation. Patient did not improve with conservative management. 1/15, underwent open surgical ileocolectomy and anastomosis.   Subjective:  Patient seen and examined.  Came back from procedure.  He feels tired. He asked me about the colostomy bag and I told him he did not need one.    Assessment & Plan:   Partial small bowel obstruction, high-grade small bowel obstruction. 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 Still with significant symptoms. Did not improve with conservative management.  Underwent surgical resection and anastomosis today.  GI and surgery following.  Biopsies pending.  Postop management as per surgery. Received 1 dose of Solu-Medrol  yesterday.  AKI from dehydration: Continue maintenance fluid and monitor.  CT scan did not show any evidence of urinary retention or hydronephrosis.  Recheck tomorrow morning.   paroxysmal A-flutter: Patient is currently in sinus rhythm.  He is not on any rate control medications.  Was Therapeutic on Eliquis .  Last dose was 1/12 morning.  Received heparin  perioperative.  Will hold any  heparin  today.  Lovenox  for DVT prophylaxis.  Essential hypertension: Blood pressures stable.  Lisinopril  on hold.  Hyperlipidemia: On statins.  Hold until oral intake.    DVT prophylaxis: enoxaparin  (LOVENOX ) injection 40 mg Start: 12/31/24 0800 SCD's Start: 12/30/24 1149 SCDs Start: 12/27/24 2245 heparin  infusion   Code Status: Full code Family Communication: None at the bedside Disposition Plan: Status is: Inpatient Remains inpatient appropriate because: Significant symptoms, n.p.o., IV fluids.  Demented postop.     Consultants:  Gastroenterology General Surgery  Procedures:  Open ileo-colectomy and anstomosis   Antimicrobials:  Preoperative.     Objective: Vitals:   12/30/24 1045 12/30/24 1100 12/30/24 1115 12/30/24 1147  BP: (!) 166/62 (!) 156/62 (!) 167/63 (!) 159/61  Pulse: 84 89 89 88  Resp: 20 18 17  (!) 22  Temp:    97.8 F (36.6 C)  TempSrc:      SpO2: 97% 96% 95% 91%  Weight:      Height:        Intake/Output Summary (Last 24 hours) at 12/30/2024 1501 Last data filed at 12/30/2024 1106 Gross per 24 hour  Intake 3021 ml  Output 1425 ml  Net 1596 ml   Filed Weights   12/30/24 0700  Weight: 77.1 kg    Examination:  Physical Exam HENT:     Head: Normocephalic.  Cardiovascular:     Rate and Rhythm: Normal rate and regular rhythm.     Heart sounds: Normal heart sounds.  Pulmonary:     Effort: Pulmonary effort is normal.     Breath sounds: Normal breath sounds.  Abdominal:     Comments: Mildly distended and tender on palpation, surgical dressing intact . Bowel  sounds not heard   Neurological:     Mental Status: He is alert.    Ng tube with minimal drainage  Foley catheter with clear urine     Data Reviewed: I have personally reviewed following labs and imaging studies  CBC: Recent Labs  Lab 12/27/24 1430 12/28/24 0105 12/29/24 0449 12/30/24 0448  WBC 13.0* 10.9* 9.2 8.6  HGB 13.2 12.1* 11.7* 11.0*  HCT 37.8* 35.8* 34.3* 32.6*   MCV 99.0 100.6* 101.2* 101.2*  PLT 151 125* 113* 101*   Basic Metabolic Panel: Recent Labs  Lab 12/27/24 1430 12/28/24 0105 12/29/24 0449  NA 136 135 140  K 5.1 4.9 4.8  CL 103 106 107  CO2 20* 20* 18*  GLUCOSE 133* 126* 108*  BUN 30* 32* 31*  CREATININE 1.63* 1.64* 1.55*  CALCIUM 9.7 9.1 9.2   GFR: Estimated Creatinine Clearance: 42.5 mL/min (A) (by C-G formula based on SCr of 1.55 mg/dL (H)). Liver Function Tests: Recent Labs  Lab 12/27/24 1430 12/28/24 0105  AST 20 18  ALT 17 13  ALKPHOS 117 103  BILITOT 0.7 0.8  PROT 7.0 6.3*  ALBUMIN  4.6 4.0   Recent Labs  Lab 12/27/24 1430  LIPASE 22   No results for input(s): AMMONIA in the last 168 hours. Coagulation Profile: No results for input(s): INR, PROTIME in the last 168 hours. Cardiac Enzymes: No results for input(s): CKTOTAL, CKMB, CKMBINDEX, TROPONINI in the last 168 hours. BNP (last 3 results) No results for input(s): PROBNP in the last 8760 hours. HbA1C: No results for input(s): HGBA1C in the last 72 hours. CBG: No results for input(s): GLUCAP in the last 168 hours. Lipid Profile: No results for input(s): CHOL, HDL, LDLCALC, TRIG, CHOLHDL, LDLDIRECT in the last 72 hours. Thyroid Function Tests: No results for input(s): TSH, T4TOTAL, FREET4, T3FREE, THYROIDAB in the last 72 hours. Anemia Panel: No results for input(s): VITAMINB12, FOLATE, FERRITIN, TIBC, IRON, RETICCTPCT in the last 72 hours. Sepsis Labs: No results for input(s): PROCALCITON, LATICACIDVEN in the last 168 hours.  No results found for this or any previous visit (from the past 240 hours).       Radiology Studies: DG Abd Portable 1V Result Date: 12/30/2024 EXAM: 1 VIEW XRAY OF THE ABDOMEN 12/30/2024 05:10:00 AM COMPARISON: 12/28/2024 CLINICAL HISTORY: SBO (small bowel obstruction) (HCC) FINDINGS: LINES, TUBES AND DEVICES: Enteric tube in place, terminating within the gastric  fundus. BOWEL: Nonobstructive bowel gas pattern. Interval decreased gaseous distention of the small bowel, now 3.9 cm in maximum diameter (previously 5.6 cm). Increasing colonic gas up to the rectum. Capsule endoscope now projects over the right lower quadrant of the abdomen possibly within distal small bowel or cecum. Previously, the capsule endoscope projected over the midline pelvis. SOFT TISSUES: No abnormal calcifications. BONES: No acute fracture. IMPRESSION: 1. Interval decreased gaseous distention of the small bowel, now 3.9 cm in maximum diameter (previously 5.6 cm). 2. Increasing colonic gas up to the rectum. 3. Capsule endoscope now projects over the right lower quadrant of the abdomen, possibly within distal small bowel or cecum, previously over the midline pelvis. Electronically signed by: Waddell Calk MD 12/30/2024 06:45 AM EST RP Workstation: [id number]   DG Abd Portable 1V-Small Bowel Obstruction Protocol-initial, 8 hr delay Result Date: 12/28/2024 EXAM: 1 VIEW XRAY OF THE ABDOMEN 12/28/2024 11:15:33 PM COMPARISON: 12/28/2024 CLINICAL HISTORY: FINDINGS: LINES, TUBES AND DEVICES: Endoscopic capsule in midline pelvis. Enteric tube in gastric fundus. BOWEL: Dilated gas-filled small bowel loops throughout the abdomen, not appreciably changed.  No contrast is noted within the colon. SOFT TISSUES: Contrast is seen within the bladder from a prior CT examination. No abnormal calcifications. BONES: No acute fracture. IMPRESSION: 1. Dilated gas-filled small bowel loops throughout the abdomen, not appreciably changed. 2. No contrast is noted within the colon. 3. Endoscopic capsule projects over the midline pelvis. Electronically signed by: Oneil Devonshire MD 12/28/2024 11:26 PM EST RP Workstation: [id number]        Scheduled Meds:  [START ON 12/31/2024] bisacodyl   10 mg Rectal Daily   [START ON 12/31/2024] enoxaparin  (LOVENOX ) injection  40 mg Subcutaneous Q24H   feeding supplement  237 mL Oral BID BM     Continuous Infusions:  sodium chloride      cefoTEtan  (CEFOTAN ) IV     dextrose  5 % and 0.45 % NaCl with KCl 20 mEq/L 50 mL/hr at 12/30/24 1427   lactated ringers      lactated ringers  75 mL/hr at 12/29/24 2027     LOS: 3 days    Time spent: 35 minutes    Renato Applebaum, MD [facility]   "

## 2024-12-30 NOTE — Anesthesia Postprocedure Evaluation (Signed)
"   Anesthesia Post Note  Patient: Timothy Peterson  Procedure(s) Performed: PARTIAL COLECTOMY UMBILICAL HERNIA REPAIR, PARTIAL (Abdomen)     Patient location during evaluation: PACU Anesthesia Type: General Level of consciousness: awake and alert Pain management: pain level controlled Vital Signs Assessment: post-procedure vital signs reviewed and stable Respiratory status: spontaneous breathing, nonlabored ventilation, respiratory function stable and patient connected to nasal cannula oxygen Cardiovascular status: blood pressure returned to baseline and stable Postop Assessment: no apparent nausea or vomiting Anesthetic complications: no   No notable events documented.  Last Vitals:  Vitals:   12/30/24 1100 12/30/24 1115  BP: (!) 156/62 (!) 167/63  Pulse: 89 89  Resp: 18 17  Temp:    SpO2: 96% 95%    Last Pain:  Vitals:   12/30/24 1115  TempSrc:   PainSc: [name]      "

## 2024-12-30 NOTE — Discharge Instructions (Signed)
 SURGERY: POST OP INSTRUCTIONS (Surgery for small bowel obstruction, colon resection, etc)   ######################################################################  EAT Gradually transition to a high fiber diet with a fiber supplement over the next few days after discharge  WALK Walk an hour a day.  Control your pain to do that.    CONTROL PAIN Control pain so that you can walk, sleep, tolerate sneezing/coughing, go up/down stairs.  HAVE A BOWEL MOVEMENT DAILY Keep your bowels regular to avoid problems.  OK to try a laxative to override constipation.  OK to use an antidairrheal to slow down diarrhea.  Call if not better after 2 tries  CALL IF YOU HAVE PROBLEMS/CONCERNS Call if you are still struggling despite following these instructions. Call if you have concerns not answered by these instructions  ######################################################################   DIET Follow a light diet the first few days at home.  Start with a bland diet such as soups, liquids, starchy foods, low fat foods, etc.  If you feel full, bloated, or constipated, stay on a ful liquid or pureed/blenderized diet for a few days until you feel better and no longer constipated. Be sure to drink plenty of fluids every day to avoid getting dehydrated (feeling dizzy, not urinating, etc.). Gradually add a fiber supplement to your diet over the next week.  Gradually get back to a regular solid diet.  Avoid fast food or heavy meals the first week as you are more likely to get nauseated. It is expected for your digestive tract to need a few months to get back to normal.  It is common for your bowel movements and stools to be irregular.  You will have occasional bloating and cramping that should eventually fade away.  Until you are eating solid food normally, off all pain medications, and back to regular activities; your bowels will not be normal. Focus on eating a low-fat, high fiber diet the rest of your life  (See Getting to Good Bowel Health, below).  CARE of your INCISION or WOUND  It is good for closed incisions and even open wounds to be washed every day.  Shower every day.  Short baths are fine.  Wash the incisions and wounds clean with soap & water .    You may leave closed incisions open to air if it is dry.   You may cover the incision with clean gauze & replace it after your daily shower for comfort.  TEGADERM:  You have clear gauze band-aid dressings over your closed incision(s).  Remove the dressings 2 days after surgery = 1/17 Saturday.    If you have an open wound with a wound vac, see wound vac care instructions.    ACTIVITIES as tolerated Start light daily activities --- self-care, walking, climbing stairs-- beginning the day after surgery.  Gradually increase activities as tolerated.  Control your pain to be active.  Stop when you are tired.  Ideally, walk several times a day, eventually an hour a day.   Most people are back to most day-to-day activities in a few weeks.  It takes 4-8 weeks to get back to unrestricted, intense activity. If you can walk 30 minutes without difficulty, it is safe to try more intense activity such as jogging, treadmill, bicycling, low-impact aerobics, swimming, etc. Save the most intensive and strenuous activity for last (Usually 4-8 weeks after surgery) such as sit-ups, heavy lifting, contact sports, etc.  Refrain from any intense heavy lifting or straining until you are off narcotics for pain control.  You will have  off days, but things should improve week-by-week. DO NOT PUSH THROUGH PAIN.  Let pain be your guide: If it hurts to do something, don't do it.  Pain is your body warning you to avoid that activity for another week until the pain goes down. You may drive when you are no longer taking narcotic prescription pain medication, you can comfortably wear a seatbelt, and you can safely make sudden turns/stops to protect yourself without hesitating due  to pain. You may have sexual intercourse when it is comfortable. If it hurts to do something, stop.   MEDICATIONS Take your usually prescribed home medications unless otherwise directed.    Blood thinners:  You can restart any strong blood thinners after the second postoperative day  for example: COUMADIN (warfarin), XERELTO (rivaroxaban), ELIQUIS  (apixaban ), PLAVIX (clopidigrel), BRILINTA (ticagrelor), EFFIENT (prasugrel), PRADAXA (dabigatran), etc  Continue aspirin before & after surgery..     Some oozing/bleeding the first 1-2 weeks is common but should taper down & be small volume.    If you are passing many large clots or having uncontrolling bleeding, call your surgeon    PAIN CONTROL Pain after surgery or related to activity is often due to strain/injury to muscle, tendon, nerves and/or incisions.  This pain is usually short-term and will improve in a few months.  To help speed the process of healing and to get back to regular activity more quickly, DO THE FOLLOWING THINGS TOGETHER: Increase activity gradually.  DO NOT PUSH THROUGH PAIN Use Ice and/or Heat Try Gentle Massage and/or Stretching Take over the counter pain medication Take Narcotic prescription pain medication for more severe pain  Good pain control = faster recovery.  It is better to take more medicine to be more active than to stay in bed all day to avoid medications.  Increase activity gradually Avoid heavy lifting at first, then increase to lifting as tolerated over the next 6 weeks. Do not push through the pain.  Listen to your body and avoid positions and maneuvers than reproduce the pain.  Wait a few days before trying something more intense Walking an hour a day is encouraged to help your body recover faster and more safely.  Start slowly and stop when getting sore.  If you can walk 30 minutes without stopping or pain, you can try more intense activity (running, jogging, aerobics, cycling, swimming,  treadmill, sex, sports, weightlifting, etc.) Remember: If it hurts to do it, then dont do it! Use Ice and/or Heat You will have swelling and bruising around the incisions.  This will take several weeks to resolve. Ice packs or heating pads (6-8 times a day, 30-60 minutes at a time) will help sooth soreness & bruising. Some people prefer to use ice alone, heat alone, or alternate between ice & heat.  Experiment and see what works best for you.  Consider trying ice for the first few days to help decrease swelling and bruising; then, switch to heat to help relax sore spots and speed recovery. Shower every day.  Short baths are fine.  It feels good!  Keep the incisions and wounds clean with soap & water .   Try Gentle Massage and/or Stretching Massage at the area of pain many times a day Stop if you feel pain - do not overdo it Take over the counter pain medication This helps the muscle and nerve tissues become less irritable and calm down faster Choose ONE of the following over-the-counter anti-inflammatory medications: Acetaminophen  500mg  tabs (Tylenol ) 1-2 pills with every  meal and just before bedtime (avoid if you have liver problems or if you have acetaminophen  in you narcotic prescription) Naproxen 220mg  tabs (ex. Aleve, Naprosyn) 1-2 pills twice a day (avoid if you have kidney, stomach, IBD, or bleeding problems) Ibuprofen 200mg  tabs (ex. Advil, Motrin) 3-4 pills with every meal and just before bedtime (avoid if you have kidney, stomach, IBD, or bleeding problems) Take with food/snack several times a day as directed for at least 2 weeks to help keep pain / soreness down & more manageable. Take Narcotic prescription pain medication for more severe pain A prescription for strong pain control is often given to you upon discharge (for example: oxycodone/Percocet, hydrocodone /Norco/Vicodin, or tramadol/Ultram) Take your pain medication as prescribed. Be mindful that most narcotic prescriptions  contain Tylenol  (acetaminophen ) as well - avoid taking too much Tylenol . If you are having problems/concerns with the prescription medicine (does not control pain, nausea, vomiting, rash, itching, etc.), please call us  [phone] to see if we need to switch you to a different pain medicine that will work better for you and/or control your side effects better. If you need a refill on your pain medication, you must call the office before 4 pm and on weekdays only.  By federal law, prescriptions for narcotics cannot be called into a pharmacy.  They must be filled out on paper & picked up from our office by the patient or authorized caretaker.  Prescriptions cannot be filled after 4 pm nor on weekends.    WHEN TO CALL US  [phone] Severe uncontrolled or worsening pain  Fever over 101 F (38.5 C) Concerns with the incision: Worsening pain, redness, rash/hives, swelling, bleeding, or drainage Reactions / problems with new medications (itching, rash, hives, nausea, etc.) Nausea and/or vomiting Difficulty urinating Difficulty breathing Worsening fatigue, dizziness, lightheadedness, blurred vision Other concerns If you are not getting better after two weeks or are noticing you are getting worse, contact our office [phone] for further advice.  We may need to adjust your medications, re-evaluate you in the office, send you to the emergency room, or see what other things we can do to help. The clinic staff is available to answer your questions during regular business hours (8:30am-5pm).  Please dont hesitate to call and ask to speak to one of our nurses for clinical concerns.    A surgeon from [facility] is always on call at the hospitals 24 hours/day If you have a medical emergency, go to the nearest emergency room or call 911.  FOLLOW UP in our office One the day of your discharge from the hospital (or the next business weekday), please call [facility] to set up  or confirm an appointment to see your surgeon in the office for a follow-up appointment.  Usually it is 2-3 weeks after your surgery.   If you have skin staples at your incision(s), let the office know so we can set up a time in the office for the nurse to remove them (usually around 10 days after surgery). Make sure that you call for appointments the day of discharge (or the next business weekday) from the hospital to ensure a convenient appointment time. IF YOU HAVE DISABILITY OR FAMILY LEAVE FORMS, BRING THEM TO THE OFFICE FOR PROCESSING.  DO NOT GIVE THEM TO YOUR DOCTOR.  [facility] [address], [address], [place], [place]  [address] ? [phone] - Main [phone] - Toll Free,  [phone] - Fax [email or web address]  GETTING TO GOOD BOWEL HEALTH. It is expected for your digestive tract to need a few months to get back to normal.  It is common for your bowel movements and stools to be irregular.  You will have occasional bloating and cramping that should eventually fade away.  Until you are eating solid food normally, off all pain medications, and back to regular activities; your bowels will not be normal.   Avoiding constipation The goal: ONE SOFT BOWEL MOVEMENT A DAY!    Drink plenty of fluids.  Choose water  first. TAKE A FIBER SUPPLEMENT EVERY DAY THE REST OF YOUR LIFE During your first week back home, gradually add back a fiber supplement every day Experiment which form you can tolerate.   There are many forms such as powders, tablets, wafers, gummies, etc Psyllium bran (Metamucil), methylcellulose (Citrucel), Miralax or Glycolax, Benefiber, Flax Seed.  Adjust the dose week-by-week (1/2 dose/day to 6 doses a day) until you are moving your bowels 1-2 times a day.  Cut back the dose or try a different fiber product if it is giving you problems such as diarrhea or bloating. Sometimes a laxative is needed to help jump-start bowels if  constipated until the fiber supplement can help regulate your bowels.  If you are tolerating eating & you are farting, it is okay to try a gentle laxative such as double dose MiraLax, prune juice, or Milk of Magnesia.  Avoid using laxatives too often. Stool softeners can sometimes help counteract the constipating effects of narcotic pain medicines.  It can also cause diarrhea, so avoid using for too long. If you are still constipated despite taking fiber daily, eating solids, and a few doses of laxatives, call our office. Controlling diarrhea Try drinking liquids and eating bland foods for a few days to avoid stressing your intestines further. Avoid dairy products (especially milk & ice cream) for a short time.  The intestines often can lose the ability to digest lactose when stressed. Avoid foods that cause gassiness or bloating.  Typical foods include beans and other legumes, cabbage, broccoli, and dairy foods.  Avoid greasy, spicy, fast foods.  Every person has some sensitivity to other foods, so listen to your body and avoid those foods that trigger problems for you. Probiotics (such as active yogurt, Align, etc) may help repopulate the intestines and colon with normal bacteria and calm down a sensitive digestive tract Adding a fiber supplement gradually can help thicken stools by absorbing excess fluid and retrain the intestines to act more normally.  Slowly increase the dose over a few weeks.  Too much fiber too soon can backfire and cause cramping & bloating. It is okay to try and slow down diarrhea with a few doses of antidiarrheal medicines.   Bismuth subsalicylate (ex. Kayopectate, Pepto Bismol) for a few doses can help control diarrhea.  Avoid if pregnant.   Loperamide (Imodium) can slow down diarrhea.  Start with one tablet (2mg ) first.  Avoid if you are having fevers or severe pain.  ILEOSTOMY PATIENTS WILL HAVE CHRONIC DIARRHEA since their colon is not in use.    Drink plenty of liquids.   You will need to drink even more glasses of water /liquid a day to avoid getting dehydrated. Record output from your ileostomy.  Expect to empty the bag every 3-4 hours at first.  Most people with a permanent ileostomy empty their bag 4-6 times at the least.   Use antidiarrheal medicine (especially Imodium) several times a day to avoid getting dehydrated.  Start with a dose at bedtime & breakfast.  Adjust up or down as needed.  Increase antidiarrheal medications as directed to avoid emptying the bag more than 8 times a day (every 3 hours). Work with your wound ostomy nurse to learn care for your ostomy.  See ostomy care instructions. TROUBLESHOOTING IRREGULAR BOWELS 1) Start with a soft & bland diet. No spicy, greasy, or fried foods.  2) Avoid gluten/wheat or dairy products from diet to see if symptoms improve. 3) Miralax 17gm or flax seed mixed in 8oz. water  or juice-daily. May use 2-4 times a day as needed. 4) Gas-X, Phazyme, etc. as needed for gas & bloating.  5) Prilosec (omeprazole) over-the-counter as needed 6)  Consider probiotics (Align, Activa, etc) to help calm the bowels down  Call your doctor if you are getting worse or not getting better.  Sometimes further testing (cultures, endoscopy, X-ray studies, CT scans, bloodwork, etc.) may be needed to help diagnose and treat the cause of the diarrhea. [facility] [address], [place], [place]  [address] [phone] - Main.    [phone]  - Toll Free.   [phone] - Fax [email or web address]

## 2024-12-30 NOTE — Op Note (Signed)
 12/27/2024 - 12/30/2024  10:25 AM  PATIENT:  Timothy Peterson  79 y.o. 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  This assistant was needed for exposure, dissection, suction, tissue approximation, retraction, perception, etc  ANESTHESIA:  General endotracheal intubation anesthesia (GETA) and Regional TRANSVERSUS ABDOMINIS PLANE (TAP) nerve block -BILATERAL for perioperative & postoperative pain control at the level of the transverse abdominis & preperitoneal spaces along the flank at the anterior axillary line, from subcostal ridge to iliac crest under laparoscopic guidance provided with 40 mL of bupivicaine 0.25% with epinephrine   Estimated Blood Loss (EBL):   Total I/O In: 1250 [I.V.:1000; IV Piggyback:250] Out: - .   (See anesthesia record)  Delay start of Pharmacological VTE agent (>24hrs) due to concerns of significant anemia, surgical blood loss, or risk of bleeding?:  yes  DRAINS: (None)  SPECIMEN: Distal ileum and cecum with appendix  DISPOSITION OF SPECIMEN:  Pathology  COUNTS:  Sponge, needle, & instrument counts CORRECT at the conclusion of the case.      PLAN OF CARE: Admit to inpatient   PATIENT DISPOSITION:  PACU - hemodynamically  stable.  INDICATION:    79 year old male with intermittent obstructive symptoms suspicious for distal ileal thickening and stricturing.  Suspicious for Crohn's.  Follow bilateral Gastroenterology.  Underwent capsule endoscopy workup which has persistently remained lodged in the distal ileum with obstructive symptoms.  Did not seem to improve with a steroid trial and NG tube decompression.  I recommended segmental resection:  The anatomy & physiology of the digestive tract was discussed.  The pathophysiology was discussed.  Natural history risks without surgery was discussed.   I worked to give an overview of the disease and the frequent need to have multispecialty involvement.  I feel the risks of no intervention will lead to serious problems that outweigh the operative risks; therefore, I recommended a partial colectomy to remove the pathology.  Laparoscopic & open techniques were discussed.   Risks such as bleeding, infection, abscess, leak, reoperation, possible ostomy, hernia, heart attack, death, and other risks were discussed.  I noted a good likelihood this will help address the problem.   Goals of post-operative recovery were discussed as well.  We will work to minimize complications.  An educational handout on the pathology was given as well.  Questions were answered.    The patient expresses understanding & wishes to proceed with surgery.  OR FINDINGS:  Patient had distal ileal creeping fat and inflammation with 3 distal tight strictures and thickened region of terminal ileum close to the ileocecal valve.  Ileocecal valve seemed to have some sparing but retained capsule near the ileocecal region.  Ileocolectomy done.  It is an  ileocolonic anastomosis (mid distal ileum to proximal ascending colon ) that rests in the right lower quadrant.  Umbilical hernia 1cm incarcerated with omentum.  Reduced.  Primary suture repair  done  CASE DATA:  Type of patient?: LDOW CASE (Surgical Hospitalist  [facility] Inpatient) Status of Case? URGENT Add On Infection Present At Time Of Surgery (PATOS)?  PHLEGMON  DESCRIPTION:   Informed consent was confirmed.  The patient underwent general anaesthesia without difficulty.  The patient was positioned appropriately.  VTE prevention in place.  The patient's abdomen was clipped, prepped, & draped in a sterile fashion.  Surgical timeout confirmed our plan.  Peritoneal entry with a laparoscopic port was obtained using Varess spring needle entry technique in the left upper abdomen as the patient was positioned in reverse Trendelenburg.  I induced carbon dioxide insufflation.  No change in end tidal CO2 measurements.  Full symmetrical abdominal distention.  Initial port was carefully placed.  Camera inspection revealed no injury.  Extra ports were carefully placed under direct laparoscopic visualization.  Patient had some omental adhesions periumbilically consistent with incarcerated and a periumbilical hernia.  1 cm.  Patient had dilated small bowel.  Could find phlegmonous distal ileum with significantly edema and creeping fat.  At least 3 strictures within this.  Suspected the capsule retained within this.  There was about 8 cm of sparing of the terminal ileum.  No obvious appendicitis or colitis.  Felt the patient required segmental resection.  Dr. Teresa with colorectal surgery partner came in and agreed.  Elevated the terminal ileum mesentery and transected through the thickened segment using vessel Enseal liver layer by layer appears quite thickened.  I chose an area in the mid distal ileum that was noninflamed without creeping fat.  Show another region in the more distal ileum close to the ileocecal valve.  Transected the mesentery and in a radial wedgelike fashion using the Enseal vessel sealer carefully layer by layer to assure hemostasis.  Mobilized the ileocecal region.  It did reach down into the pelvis.  I placed a wound protector through a Pfannenstiel  incision.  With that, I was able to eviserate the distal ileum and proximal colon.  I could isolate the pathology. When he went ahead and proceeded with transection.  I was hoping to try and spare the terminal ileum and ileocecal valve since it was not definitely inflamed but it was a very short segment and the capsule was actually somewhat stuck in there.  I did not think I could safely do an anastomosis with short terminal ileum left so I therefore transected till I came just past the cecum to healthy noninflamed ascending colon.    I did a side-to-side stapled anastomosis of ileum to proximal ascending colon using a 75mm GIA stapler.  We then transected the specimen off (including the common bowel defect) using a TX-90 stapler.  Because his tissues were fair and up imbricating the TX 90 staple line with interrupted silk sutures taking special care at the corners to avoid any leaking.  I closed off the common mesenteric defect using interrupted silk stitches to avoid any internal hernias.  We did reinspection of the abdomen.  Hemostasis was good.   Ureters, retroperitoneum, and bowel uninjured.  The anastomosis looked healthy.   We did a serial irrigation of sterile isotonic solution with clear result and confirmed good hemostasis.    All instruments were removed.  The patient was re-draped.  Sterile unused instruments were used from this point out per colon SSI prevention protocol.  We reinspected the abdomen.  Hemostasis was good.   The anastomosis looked healthy.  Brought healthy omentum to lay down towards the pelvis and over  the anastomosis and protect against the Pfannenstiel incision.  We closed the Pfannenstiel incision using 0 Vicryl in the retrorectus space vertically and then the anterior rectus fascia using #1 PDS in a running fashion transversely.  I then did a supraumbilical transverse curvilinear incision to free the umbilical stalk off to expose the 1 cm hernia.  Closed that with interrupted #1  PDS suture and retacked to the umbilical stalk down to the fascia using a 0 Vicryl suture.  Skin at ports umbilical and Pfannenstiel incision were closed using Monocryl.  Sterile dressings applied.  I discussed operative findings, updated the patient's status, discussed probable steps to recovery, and gave postoperative recommendations to the patient's spouse, Cotey Rakes.  Recommendations were made.  Questions were answered.  She expressed understanding & appreciation.   Elspeth KYM Schultze, M.D., F.A.C.S. Gastrointestinal and Minimally Invasive Surgery [facility] [address] [place], [place] [address] [phone] Main / Paging

## 2024-12-31 ENCOUNTER — Encounter (HOSPITAL_COMMUNITY): Payer: Self-pay | Admitting: Surgery

## 2024-12-31 ENCOUNTER — Inpatient Hospital Stay (HOSPITAL_COMMUNITY)

## 2024-12-31 DIAGNOSIS — K50012 Crohn's disease of small intestine with intestinal obstruction: Secondary | ICD-10-CM | POA: Diagnosis not present

## 2024-12-31 DIAGNOSIS — K56609 Unspecified intestinal obstruction, unspecified as to partial versus complete obstruction: Secondary | ICD-10-CM | POA: Diagnosis not present

## 2024-12-31 DIAGNOSIS — K529 Noninfective gastroenteritis and colitis, unspecified: Secondary | ICD-10-CM | POA: Diagnosis not present

## 2024-12-31 DIAGNOSIS — T183XXA Foreign body in small intestine, initial encounter: Secondary | ICD-10-CM | POA: Diagnosis not present

## 2024-12-31 LAB — BASIC METABOLIC PANEL WITH GFR
Anion gap: 12 (ref 5–15)
BUN: 35 mg/dL — ABNORMAL HIGH (ref 8–23)
CO2: 21 mmol/L — ABNORMAL LOW (ref 22–32)
Calcium: 8.7 mg/dL — ABNORMAL LOW (ref 8.9–10.3)
Chloride: 112 mmol/L — ABNORMAL HIGH (ref 98–111)
Creatinine, Ser: 1.64 mg/dL — ABNORMAL HIGH (ref 0.61–1.24)
GFR, Estimated: 43 mL/min — ABNORMAL LOW
Glucose, Bld: 188 mg/dL — ABNORMAL HIGH (ref 70–99)
Potassium: 5 mmol/L (ref 3.5–5.1)
Sodium: 146 mmol/L — ABNORMAL HIGH (ref 135–145)

## 2024-12-31 LAB — CBC
HCT: 31.1 % — ABNORMAL LOW (ref 39.0–52.0)
Hemoglobin: 10.1 g/dL — ABNORMAL LOW (ref 13.0–17.0)
MCH: 34 pg (ref 26.0–34.0)
MCHC: 32.5 g/dL (ref 30.0–36.0)
MCV: 104.7 fL — ABNORMAL HIGH (ref 80.0–100.0)
Platelets: 114 K/uL — ABNORMAL LOW (ref 150–400)
RBC: 2.97 MIL/uL — ABNORMAL LOW (ref 4.22–5.81)
RDW: 15.1 % (ref 11.5–15.5)
WBC: 7.3 K/uL (ref 4.0–10.5)
nRBC: 0 % (ref 0.0–0.2)

## 2024-12-31 LAB — MAGNESIUM: Magnesium: 2.6 mg/dL — ABNORMAL HIGH (ref 1.7–2.4)

## 2024-12-31 MED ORDER — ACETAMINOPHEN 10 MG/ML IV SOLN
1000.0000 mg | Freq: Four times a day (QID) | INTRAVENOUS | Status: AC
  Administered 2024-12-31 – 2025-01-01 (×4): 1000 mg via INTRAVENOUS
  Filled 2024-12-31 (×5): qty 100

## 2024-12-31 MED ORDER — HYDROMORPHONE HCL 1 MG/ML IJ SOLN: 0.5000 mg | INTRAMUSCULAR | Status: DC | PRN

## 2024-12-31 MED ORDER — METHOCARBAMOL 1000 MG/10ML IJ SOLN
1000.0000 mg | Freq: Three times a day (TID) | INTRAMUSCULAR | Status: DC
  Administered 2024-12-31 – 2025-01-06 (×18): 1000 mg via INTRAVENOUS
  Filled 2024-12-31 (×18): qty 10

## 2024-12-31 MED ORDER — KCL IN DEXTROSE-NACL 20-5-0.45 MEQ/L-%-% IV SOLN
INTRAVENOUS | Status: AC
  Filled 2024-12-31 (×3): qty 1000

## 2024-12-31 NOTE — Progress Notes (Signed)
 " PROGRESS NOTE    Timothy Peterson  FMW:[id number] DOB: 22-Jan-1946 DOA: 12/27/2024 PCP: [facility]    Brief Narrative:  79 year old with history of hypertension, hyperlipidemia, A-flutter on Eliquis  who has been followed by gastroenterology for the last several months with history of diarrhea and enteritis.  Patient also reported 20 pound weight loss last 5 years.  Recent EGD and colonoscopy, capsule endoscopy on 1/9.  After about 24 hours of capsule endoscopy patient started having lower abdominal discomfort with nausea hiccups and burping.  In the emergency room hemodynamically stable.  Complaining of abdominal pain relieved with morphine .  Creatinine 1.6 with recent creatinine of 1.1.  CT scan abdomen pelvis with high-grade small bowel obstruction, capsule endoscopy still at the ileum.  Admitted with GI and surgery consultation. Patient did not improve with conservative management. 1/15, underwent open surgical ileocolectomy and anastomosis.   Subjective: Patient seen and examined.  He tells me he is tired.  Reported passing flatus.  Pain comes in episodes and using some IV pain medications.  NG tube is uncomfortable.  Looking forward to walk today.   Assessment & Plan:   Partial small bowel obstruction, high-grade small bowel obstruction. 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 Did not improve with conservative management.  Underwent surgical resection and anastomosis.  Day 1 postop today.  GI and surgery following.  Biopsies pending.  Postop management as per surgery. Received 1 dose of Solu-Medrol .  Holding until surgical recovery.  AKI from dehydration: Continue maintenance fluid and monitor.  CT scan did not show any evidence of urinary retention or hydronephrosis.     paroxysmal A-flutter: Patient is currently in sinus rhythm.  He is not on any rate control medications.  Was Therapeutic on Eliquis .  Last dose was 1/12 morning.  Received  heparin  perioperative.  Will hold any heparin  . Lovenox  for DVT prophylaxis.  Will start Eliquis  when cleared by surgery.  Essential hypertension: Blood pressures stable.  Lisinopril  on hold.  As needed medications.  Hyperlipidemia: On statins.  Hold until oral intake.    DVT prophylaxis: enoxaparin  (LOVENOX ) injection 40 mg Start: 12/31/24 0800 SCD's Start: 12/30/24 1149 SCDs Start: 12/27/24 2245 heparin  infusion   Code Status: Full code Family Communication: None at the bedside Disposition Plan: Status is: Inpatient Remains inpatient appropriate because: Significant symptoms, n.p.o., IV fluids.  Immediate postop.     Consultants:  Gastroenterology General Surgery  Procedures:  Open ileo-colectomy and anstomosis   Antimicrobials:  Preoperative.     Objective: Vitals:   12/31/24 0008 12/31/24 0026 12/31/24 0452 12/31/24 0500  BP: 134/65  (!) 145/71   Pulse: 96 92 97   Resp: 14  18   Temp: (!) 97.4 F (36.3 C)  97.6 F (36.4 C)   TempSrc:      SpO2: (!) 82% 93% 94%   Weight:    83.7 kg  Height:        Intake/Output Summary (Last 24 hours) at 12/31/2024 1103 Last data filed at 12/31/2024 0500 Gross per 24 hour  Intake 1341.62 ml  Output 1200 ml  Net 141.62 ml   Filed Weights   12/30/24 0700 12/31/24 0500  Weight: 77.1 kg 83.7 kg    Examination:  Physical Exam HENT:     Head: Normocephalic.  Cardiovascular:     Rate and Rhythm: Normal rate and regular rhythm.     Heart sounds: Normal heart sounds.  Pulmonary:     Effort: Pulmonary effort is normal.  Breath sounds: Normal breath sounds.  Abdominal:     Comments: Mildly distended and mildly mildly tender on palpation, surgical dressing intact .  Bowel sounds present.  Neurological:     Mental Status: He is alert.    Ng tube with minimal drainage      Data Reviewed: I have personally reviewed following labs and imaging studies  CBC: Recent Labs  Lab 12/27/24 1430 12/28/24 0105  12/29/24 0449 12/30/24 0448 12/31/24 0453  WBC 13.0* 10.9* 9.2 8.6 7.3  HGB 13.2 12.1* 11.7* 11.0* 10.1*  HCT 37.8* 35.8* 34.3* 32.6* 31.1*  MCV 99.0 100.6* 101.2* 101.2* 104.7*  PLT 151 125* 113* 101* 114*   Basic Metabolic Panel: Recent Labs  Lab 12/27/24 1430 12/28/24 0105 12/29/24 0449 12/31/24 0842  NA 136 135 140 146*  K 5.1 4.9 4.8 5.0  CL 103 106 107 112*  CO2 20* 20* 18* 21*  GLUCOSE 133* 126* 108* 188*  BUN 30* 32* 31* 35*  CREATININE 1.63* 1.64* 1.55* 1.64*  CALCIUM 9.7 9.1 9.2 8.7*  MG  --   --   --  2.6*   GFR: Estimated Creatinine Clearance: 40.2 mL/min (A) (by C-G formula based on SCr of 1.64 mg/dL (H)). Liver Function Tests: Recent Labs  Lab 12/27/24 1430 12/28/24 0105  AST 20 18  ALT 17 13  ALKPHOS 117 103  BILITOT 0.7 0.8  PROT 7.0 6.3*  ALBUMIN  4.6 4.0   Recent Labs  Lab 12/27/24 1430  LIPASE 22   No results for input(s): AMMONIA in the last 168 hours. Coagulation Profile: No results for input(s): INR, PROTIME in the last 168 hours. Cardiac Enzymes: No results for input(s): CKTOTAL, CKMB, CKMBINDEX, TROPONINI in the last 168 hours. BNP (last 3 results) No results for input(s): PROBNP in the last 8760 hours. HbA1C: No results for input(s): HGBA1C in the last 72 hours. CBG: No results for input(s): GLUCAP in the last 168 hours. Lipid Profile: No results for input(s): CHOL, HDL, LDLCALC, TRIG, CHOLHDL, LDLDIRECT in the last 72 hours. Thyroid Function Tests: No results for input(s): TSH, T4TOTAL, FREET4, T3FREE, THYROIDAB in the last 72 hours. Anemia Panel: No results for input(s): VITAMINB12, FOLATE, FERRITIN, TIBC, IRON, RETICCTPCT in the last 72 hours. Sepsis Labs: No results for input(s): PROCALCITON, LATICACIDVEN in the last 168 hours.  No results found for this or any previous visit (from the past 240 hours).       Radiology Studies: DG Abd Portable 1V Result Date:  12/30/2024 EXAM: 1 VIEW XRAY OF THE ABDOMEN 12/30/2024 05:10:00 AM COMPARISON: 12/28/2024 CLINICAL HISTORY: SBO (small bowel obstruction) (HCC) FINDINGS: LINES, TUBES AND DEVICES: Enteric tube in place, terminating within the gastric fundus. BOWEL: Nonobstructive bowel gas pattern. Interval decreased gaseous distention of the small bowel, now 3.9 cm in maximum diameter (previously 5.6 cm). Increasing colonic gas up to the rectum. Capsule endoscope now projects over the right lower quadrant of the abdomen possibly within distal small bowel or cecum. Previously, the capsule endoscope projected over the midline pelvis. SOFT TISSUES: No abnormal calcifications. BONES: No acute fracture. IMPRESSION: 1. Interval decreased gaseous distention of the small bowel, now 3.9 cm in maximum diameter (previously 5.6 cm). 2. Increasing colonic gas up to the rectum. 3. Capsule endoscope now projects over the right lower quadrant of the abdomen, possibly within distal small bowel or cecum, previously over the midline pelvis. Electronically signed by: Waddell Calk MD 12/30/2024 06:45 AM EST RP Workstation: [id number]        Scheduled  Meds:  bisacodyl   10 mg Rectal Daily   enoxaparin  (LOVENOX ) injection  40 mg Subcutaneous Q24H   methocarbamol  (ROBAXIN ) injection  1,000 mg Intravenous Q8H    Continuous Infusions:  sodium chloride      acetaminophen      dextrose  5 % and 0.45 % NaCl with KCl 20 mEq/L 50 mL/hr at 12/31/24 0812   lactated ringers        LOS: 4 days    Time spent: 35 minutes    Renato Applebaum, MD [facility]   "

## 2024-12-31 NOTE — Progress Notes (Signed)
 "    [facility] Progress Note  CC:  SBO, suspected Crohn's, retained capsule   Subjective:  Feels ok.  Just urinated after having foley removed and said that was painful.  Having a lot of secretions from NGT and that continues to be very uncomfortable.  Objective:  Vital signs in last 24 hours: Temp:  [97.4 F (36.3 C)-98.4 F (36.9 C)] 97.6 F (36.4 C) (01/16 0452) Pulse Rate:  [79-97] 97 (01/16 0452) Resp:  [14-26] 18 (01/16 0452) BP: (134-167)/(61-71) 145/71 (01/16 0452) SpO2:  [82 %-100 %] 94 % (01/16 0452) Weight:  [83.7 kg] 83.7 kg (01/16 0500) Last BM Date : 12/24/24 General:  Alert, Well-developed, in NAD Heart:  Regular rate and rhythm; no murmurs Pulm:  CTAB.  No W/R/R. Abdomen:  Soft, appropriately tender, dressings noted on incisions.  NGT with minimal bilious output. Extremities:  Without edema. Neurologic:  Alert and  oriented x4;  grossly normal neurologically. Psych:  Alert and cooperative. Normal mood and affect.  Intake/Output from previous day: 01/15 0701 - 01/16 0700 In: 2891.6 [P.O.:90; I.V.:1751.6; NG/GT:600; IV Piggyback:450] Out: 1250 [Urine:1100; Emesis/NG output:100; Blood:50]  Lab Results: Recent Labs    12/29/24 0449 12/30/24 0448 12/31/24 0453  WBC 9.2 8.6 7.3  HGB 11.7* 11.0* 10.1*  HCT 34.3* 32.6* 31.1*  PLT 113* 101* 114*   BMET Recent Labs    12/29/24 0449  NA 140  K 4.8  CL 107  CO2 18*  GLUCOSE 108*  BUN 31*  CREATININE 1.55*  CALCIUM 9.2   DG Abd Portable 1V Result Date: 12/30/2024 EXAM: 1 VIEW XRAY OF THE ABDOMEN 12/30/2024 05:10:00 AM COMPARISON: 12/28/2024 CLINICAL HISTORY: SBO (small bowel obstruction) (HCC) FINDINGS: LINES, TUBES AND DEVICES: Enteric tube in place, terminating within the gastric fundus. BOWEL: Nonobstructive bowel gas pattern. Interval decreased gaseous distention of the small bowel, now 3.9 cm in maximum diameter (previously 5.6 cm). Increasing colonic gas up to the rectum. Capsule  endoscope now projects over the right lower quadrant of the abdomen possibly within distal small bowel or cecum. Previously, the capsule endoscope projected over the midline pelvis. SOFT TISSUES: No abnormal calcifications. BONES: No acute fracture. IMPRESSION: 1. Interval decreased gaseous distention of the small bowel, now 3.9 cm in maximum diameter (previously 5.6 cm). 2. Increasing colonic gas up to the rectum. 3. Capsule endoscope now projects over the right lower quadrant of the abdomen, possibly within distal small bowel or cecum, previously over the midline pelvis. Electronically signed by: Waddell Calk MD 12/30/2024 06:45 AM EST RP Workstation: [id number]   Assessment / Plan: 79 y.o. male with past medical history significant for atrial flutter, gout, hypertension, hyperlipidemia, and chronic diarrhea presents for evaluation of small bowel obstruction and retained capsule endoscopy.   Workup in November 2025 showed CT scans with small bowel inflammation, elevated fecal calprotectin, with negative EGD/colonoscopy.   VCE placed 1/9 with development of obstructive symptoms within 24 hours   CTAP 1/12 with SBO and inflammatory ileitis characterized by by narrowing segment in distal ileum in the RLQ with moderate wall thickening and mucosal enhancement felt to be a stricturing with capsule endoscopy noted to be downstream from this with second regional wall thickening and mucosal enhancement and apparent narrowing distal to this as well.  No free air.   Small bowel obstruction in the setting of suspected stricturing Crohn's disease with retained capsule  Went to the OR for ileocecectomy and umbilical hernia repair for incarcerated umbilical hernia on 1/15.   - Monitor  electrolytes,  K >=4, Phos >= 3, Mg >= 2  -- Continue to monitor NG tube output and plans for this and any diet per surgery. -- Will await surgical path.   LOS: 4 days   Harlene BIRCH. Nichelle Renwick  12/31/2024, 9:32 AM    "

## 2024-12-31 NOTE — Progress Notes (Signed)
 Pt refused to ambulate at this time.Educated pt. Was up in chair, transferred to bed and dangled feet. Wants to try tomorrow.

## 2024-12-31 NOTE — Progress Notes (Signed)
 "   1 Day Post-Op  Subjective: Pain seems relatively well controlled today.  Having a lot of phlegm secondary to NGT.  Foley out, hasn't voided yet.    Objective: Vital signs in last 24 hours: Temp:  [97.4 F (36.3 C)-98.4 F (36.9 C)] 97.6 F (36.4 C) (01/16 0452) Pulse Rate:  [79-97] 97 (01/16 0452) Resp:  [14-26] 18 (01/16 0452) BP: (134-167)/(61-71) 145/71 (01/16 0452) SpO2:  [82 %-100 %] 94 % (01/16 0452) Weight:  [83.7 kg] 83.7 kg (01/16 0500) Last BM Date : 12/24/24  Intake/Output from previous day: 01/15 0701 - 01/16 0700 In: 2891.6 [P.O.:90; I.V.:1751.6; NG/GT:600; IV Piggyback:450] Out: 1250 [Urine:1100; Emesis/NG output:100; Blood:50] Intake/Output this shift: No intake/output data recorded.  PE: Gen: NAD Abd: soft, appropriately tender, incisions c/d/I with gauze and tegaderm over port sites and honeycomb over pfannenstiel incision.  NGT with minimal bilious output currently  Lab Results:  Recent Labs    12/30/24 0448 12/31/24 0453  WBC 8.6 7.3  HGB 11.0* 10.1*  HCT 32.6* 31.1*  PLT 101* 114*   BMET Recent Labs    12/29/24 0449  NA 140  K 4.8  CL 107  CO2 18*  GLUCOSE 108*  BUN 31*  CREATININE 1.55*  CALCIUM 9.2   PT/INR No results for input(s): LABPROT, INR in the last 72 hours. CMP     Component Value Date/Time   NA 140 12/29/2024 0449   K 4.8 12/29/2024 0449   CL 107 12/29/2024 0449   CO2 18 (L) 12/29/2024 0449   GLUCOSE 108 (H) 12/29/2024 0449   BUN 31 (H) 12/29/2024 0449   CREATININE 1.55 (H) 12/29/2024 0449   CALCIUM 9.2 12/29/2024 0449   PROT 6.3 (L) 12/28/2024 0105   ALBUMIN  4.0 12/28/2024 0105   AST 18 12/28/2024 0105   ALT 13 12/28/2024 0105   ALKPHOS 103 12/28/2024 0105   BILITOT 0.8 12/28/2024 0105   GFRNONAA 46 (L) 12/29/2024 0449   GFRAA >60 06/29/2016 1838   Lipase     Component Value Date/Time   LIPASE 22 12/27/2024 1430       Studies/Results: DG Abd Portable 1V Result Date: 12/30/2024 EXAM: 1 VIEW  XRAY OF THE ABDOMEN 12/30/2024 05:10:00 AM COMPARISON: 12/28/2024 CLINICAL HISTORY: SBO (small bowel obstruction) (HCC) FINDINGS: LINES, TUBES AND DEVICES: Enteric tube in place, terminating within the gastric fundus. BOWEL: Nonobstructive bowel gas pattern. Interval decreased gaseous distention of the small bowel, now 3.9 cm in maximum diameter (previously 5.6 cm). Increasing colonic gas up to the rectum. Capsule endoscope now projects over the right lower quadrant of the abdomen possibly within distal small bowel or cecum. Previously, the capsule endoscope projected over the midline pelvis. SOFT TISSUES: No abnormal calcifications. BONES: No acute fracture. IMPRESSION: 1. Interval decreased gaseous distention of the small bowel, now 3.9 cm in maximum diameter (previously 5.6 cm). 2. Increasing colonic gas up to the rectum. 3. Capsule endoscope now projects over the right lower quadrant of the abdomen, possibly within distal small bowel or cecum, previously over the midline pelvis. Electronically signed by: Waddell Calk MD 12/30/2024 06:45 AM EST RP Workstation: [id number]    Anti-infectives: Anti-infectives (From admission, onward)    Start     Dose/Rate Route Frequency Ordered Stop   12/30/24 2000  cefoTEtan  (CEFOTAN ) 2 g in sodium chloride  0.9 % 100 mL IVPB        2 g 200 mL/hr over 30 Minutes Intravenous Every 12 hours 12/30/24 1149 12/30/24 2159   12/30/24 0600  cefoTEtan  (CEFOTAN ) 2 g in sodium chloride  0.9 % 100 mL IVPB        2 g 200 mL/hr over 30 Minutes Intravenous On call to O.R. 12/29/24 1338 12/30/24 0805        Assessment/Plan POD 1, s/p lap assisted ileocecectomy by Dr. Sheldon, 11/15 for SBO secondary to presumed Crohn's disease -cont NGT and await bowel function.  Having some flatus today, but no BM -no further abx needed -mobilize, will have PT see him -yankaeur for suction to help with secretions -adjust multi-modal pain control -foley out this am, await voiding -path  pending  FEN - NPO/NGT/IVFs VTE - heparin  ID - none currently needed    LOS: 4 days    Burnard FORBES Banter , [facility] 12/31/2024, 9:29 AM Please see Amion for pager number during day hours 7:00am-4:30pm or 7:00am -11:30am on weekends  "

## 2024-12-31 NOTE — Plan of Care (Signed)
" °  Problem: Clinical Measurements: Goal: Ability to maintain clinical measurements within normal limits will improve Outcome: Progressing Goal: Diagnostic test results will improve Outcome: Progressing Goal: Respiratory complications will improve Outcome: Progressing Goal: Cardiovascular complication will be avoided Outcome: Progressing   Problem: Skin Integrity: Goal: Risk for impaired skin integrity will decrease Outcome: Progressing   Problem: Bowel/Gastric: Goal: Gastrointestinal status for postoperative course will improve Outcome: Progressing   Problem: Clinical Measurements: Goal: Ability to maintain clinical measurements within normal limits Outcome: Progressing   Problem: Skin Integrity: Goal: Demonstrates signs of wound healing without infection Outcome: Progressing   "

## 2025-01-01 DIAGNOSIS — K56609 Unspecified intestinal obstruction, unspecified as to partial versus complete obstruction: Secondary | ICD-10-CM | POA: Diagnosis not present

## 2025-01-01 DIAGNOSIS — K50012 Crohn's disease of small intestine with intestinal obstruction: Secondary | ICD-10-CM

## 2025-01-01 LAB — CBC
HCT: 32.7 % — ABNORMAL LOW (ref 39.0–52.0)
Hemoglobin: 10.5 g/dL — ABNORMAL LOW (ref 13.0–17.0)
MCH: 34 pg (ref 26.0–34.0)
MCHC: 32.1 g/dL (ref 30.0–36.0)
MCV: 105.8 fL — ABNORMAL HIGH (ref 80.0–100.0)
Platelets: 101 K/uL — ABNORMAL LOW (ref 150–400)
RBC: 3.09 MIL/uL — ABNORMAL LOW (ref 4.22–5.81)
RDW: 14.8 % (ref 11.5–15.5)
WBC: 4.7 K/uL (ref 4.0–10.5)
nRBC: 0 % (ref 0.0–0.2)

## 2025-01-01 LAB — BASIC METABOLIC PANEL WITH GFR
Anion gap: 9 (ref 5–15)
BUN: 28 mg/dL — ABNORMAL HIGH (ref 8–23)
CO2: 24 mmol/L (ref 22–32)
Calcium: 8.7 mg/dL — ABNORMAL LOW (ref 8.9–10.3)
Chloride: 114 mmol/L — ABNORMAL HIGH (ref 98–111)
Creatinine, Ser: 1.44 mg/dL — ABNORMAL HIGH (ref 0.61–1.24)
GFR, Estimated: 50 mL/min — ABNORMAL LOW
Glucose, Bld: 181 mg/dL — ABNORMAL HIGH (ref 70–99)
Potassium: 4.5 mmol/L (ref 3.5–5.1)
Sodium: 147 mmol/L — ABNORMAL HIGH (ref 135–145)

## 2025-01-01 MED ORDER — MAGIC MOUTHWASH
15.0000 mL | Freq: Four times a day (QID) | ORAL | Status: DC
  Administered 2025-01-01 (×4): 15 mL via ORAL
  Filled 2025-01-01 (×8): qty 15

## 2025-01-01 MED ORDER — KCL IN DEXTROSE-NACL 20-5-0.45 MEQ/L-%-% IV SOLN
INTRAVENOUS | Status: DC
  Filled 2025-01-01: qty 1000

## 2025-01-01 NOTE — Progress Notes (Signed)
 " PROGRESS NOTE    Waymon Laser  FMW:[id number] DOB: 05/26/1946 DOA: 12/27/2024 PCP: [facility]    Brief Narrative:  79 year old with history of hypertension, hyperlipidemia, A-flutter on Eliquis  who has been followed by gastroenterology for the last several months with history of diarrhea and enteritis.  Patient also reported 20 pound weight loss last 5 years.  Recent EGD and colonoscopy, capsule endoscopy on 1/9.  After about 24 hours of capsule endoscopy patient started having lower abdominal discomfort with nausea hiccups and burping.  In the emergency room hemodynamically stable.  Complaining of abdominal pain relieved with morphine .  Creatinine 1.6 with recent creatinine of 1.1.  CT scan abdomen pelvis with high-grade small bowel obstruction, capsule endoscopy still at the ileum.  Admitted with GI and surgery consultation. Patient did not improve with conservative management. 1/15, underwent open surgical ileocolectomy and anastomosis.   Subjective: Patient seen and examined.  He was sitting in bedside commode, burns with urination but he is able to urinate without difficulty.  Passing some flatus however no bowel movement yet.  Pain is moderate.  Denies any nausea vomiting.   Assessment & Plan:   Partial small bowel obstruction, high-grade small bowel obstruction. 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 Did not improve with conservative management.  Underwent surgical resection and anastomosis.  Day 2 postop today.  GI and surgery following.  Biopsies pending.  Postop management as per surgery. Surgical biopsies pending. No return of bowel function yet.  AKI from dehydration: Continue maintenance fluid and monitor.  CT scan did not show any evidence of urinary retention or hydronephrosis.   Renal functions improving.  Patient without oral intake yet.  He has increased sodium and chloride.  Will continue dextrose  infusion today.  Potassium is  adequate.   paroxysmal A-flutter: Patient is currently in sinus rhythm.  He is not on any rate control medications.  Was Therapeutic on Eliquis .   Received heparin  perioperative.  Will hold any heparin  . Lovenox  for DVT prophylaxis.  Will start Eliquis  when cleared by surgery.  Essential hypertension: Blood pressures stable.  Lisinopril  on hold.  As needed medications.  Hyperlipidemia: On statins.  Hold until oral intake.    DVT prophylaxis: enoxaparin  (LOVENOX ) injection 40 mg Start: 12/31/24 0800 SCDs Start: 12/27/24 2245 heparin  infusion   Code Status: Full code Family Communication: None at the bedside Disposition Plan: Status is: Inpatient Remains inpatient appropriate because: Significant symptoms, n.p.o., IV fluids.  Immediate postop.     Consultants:  Gastroenterology General Surgery  Procedures:  Open ileo-colectomy and anstomosis   Antimicrobials:  Preoperative.     Objective: Vitals:   12/31/24 0500 12/31/24 1616 12/31/24 1957 01/01/25 0407  BP:  (!) 144/58 (!) 145/61 (!) 143/64  Pulse:  94 99 95  Resp:  16 19 19   Temp:  98.6 F (37 C) 98.5 F (36.9 C) 98.1 F (36.7 C)  TempSrc:  Oral Oral Oral  SpO2:  (!) 88% (!) 89% 96%  Weight: 83.7 kg   80.6 kg  Height:        Intake/Output Summary (Last 24 hours) at 01/01/2025 1244 Last data filed at 01/01/2025 0615 Gross per 24 hour  Intake 1314.45 ml  Output 800 ml  Net 514.45 ml   Filed Weights   12/30/24 0700 12/31/24 0500 01/01/25 0407  Weight: 77.1 kg 83.7 kg 80.6 kg    Examination:  Physical Exam HENT:     Head: Normocephalic.  Cardiovascular:     Rate  and Rhythm: Normal rate and regular rhythm.     Heart sounds: Normal heart sounds.  Pulmonary:     Effort: Pulmonary effort is normal.     Breath sounds: Normal breath sounds.  Abdominal:     Comments: Mildly distended and tender at the midline.  Bowel sound present.  Neurological:     Mental Status: He is alert.    Ng tube with minimal  drainage      Data Reviewed: I have personally reviewed following labs and imaging studies  CBC: Recent Labs  Lab 12/28/24 0105 12/29/24 0449 12/30/24 0448 12/31/24 0453 01/01/25 0432  WBC 10.9* 9.2 8.6 7.3 4.7  HGB 12.1* 11.7* 11.0* 10.1* 10.5*  HCT 35.8* 34.3* 32.6* 31.1* 32.7*  MCV 100.6* 101.2* 101.2* 104.7* 105.8*  PLT 125* 113* 101* 114* 101*   Basic Metabolic Panel: Recent Labs  Lab 12/27/24 1430 12/28/24 0105 12/29/24 0449 12/31/24 0842 01/01/25 0805  NA 136 135 140 146* 147*  K 5.1 4.9 4.8 5.0 4.5  CL 103 106 107 112* 114*  CO2 20* 20* 18* 21* 24  GLUCOSE 133* 126* 108* 188* 181*  BUN 30* 32* 31* 35* 28*  CREATININE 1.63* 1.64* 1.55* 1.64* 1.44*  CALCIUM 9.7 9.1 9.2 8.7* 8.7*  MG  --   --   --  2.6*  --    GFR: Estimated Creatinine Clearance: 45.8 mL/min (A) (by C-G formula based on SCr of 1.44 mg/dL (H)). Liver Function Tests: Recent Labs  Lab 12/27/24 1430 12/28/24 0105  AST 20 18  ALT 17 13  ALKPHOS 117 103  BILITOT 0.7 0.8  PROT 7.0 6.3*  ALBUMIN  4.6 4.0   Recent Labs  Lab 12/27/24 1430  LIPASE 22   No results for input(s): AMMONIA in the last 168 hours. Coagulation Profile: No results for input(s): INR, PROTIME in the last 168 hours. Cardiac Enzymes: No results for input(s): CKTOTAL, CKMB, CKMBINDEX, TROPONINI in the last 168 hours. BNP (last 3 results) No results for input(s): PROBNP in the last 8760 hours. HbA1C: No results for input(s): HGBA1C in the last 72 hours. CBG: No results for input(s): GLUCAP in the last 168 hours. Lipid Profile: No results for input(s): CHOL, HDL, LDLCALC, TRIG, CHOLHDL, LDLDIRECT in the last 72 hours. Thyroid Function Tests: No results for input(s): TSH, T4TOTAL, FREET4, T3FREE, THYROIDAB in the last 72 hours. Anemia Panel: No results for input(s): VITAMINB12, FOLATE, FERRITIN, TIBC, IRON, RETICCTPCT in the last 72 hours. Sepsis Labs: No  results for input(s): PROCALCITON, LATICACIDVEN in the last 168 hours.  No results found for this or any previous visit (from the past 240 hours).       Radiology Studies: DG Abd 1 View Result Date: 12/31/2024 EXAM: 1 VIEW XRAY OF THE ABDOMEN 12/31/2024 10:55:00 PM COMPARISON: 12/30/2024 CLINICAL HISTORY: [id number] Encounter for nasogastric tube placement [id number] Encounter for nasogastric tube placement [id number] Encounter for nasogastric tube placement [id number] Encounter for nasogastric tube placement [id number] Encounter for nasogastric tube placement [id number] FINDINGS: LINES, TUBES AND DEVICES: Enteric tube in place with distal tip and side port terminating within the expected location of the gastric body. BOWEL: Dilated loops of small bowel measuring at least 4.2 cm in diameter, similar to the prior study, consistent with small bowel obstruction. SOFT TISSUES: No abnormal calcifications. BONES: No acute fracture. LUNGS: Partially visualized opacity within the right lung base suspicious for pneumonia. IMPRESSION: 1. Enteric tube in place with distal tip and side port terminating within the expected location of  the gastric body. 2. Small bowel obstruction, similar to the prior study. 3. Partially visualized opacity within the right lung base suspicious for pneumonia. Electronically signed by: Oneil Devonshire MD 12/31/2024 10:58 PM EST RP Workstation: [id number]        Scheduled Meds:  bisacodyl   10 mg Rectal Daily   enoxaparin  (LOVENOX ) injection  40 mg Subcutaneous Q24H   magic mouthwash  15 mL Oral QID   methocarbamol  (ROBAXIN ) injection  1,000 mg Intravenous Q8H    Continuous Infusions:  dextrose  5 % and 0.45 % NaCl with KCl 20 mEq/L     lactated ringers  Stopped (12/31/24 1112)     LOS: 5 days    Time spent: 35 minutes    Renato Applebaum, MD [facility]   "

## 2025-01-01 NOTE — Evaluation (Signed)
 Physical Therapy Evaluation Patient Details Name: Timothy Peterson MRN: [id number] DOB: [birth year] Today's Date: 01/01/2025  History of Present Illness  Pt s/p laparoscopic partial colectomy 2* SBO and with incarcerated umbilical hernia repair performed 12/30/24.  Pt with hx of htn and gouty arthritis  Clinical Impression  Pt admitted as above and presenting with functional mobility limitations 2* generalized weakness, post op pain and decreased activity tolerance.  This date, pt assisted to standing and ambulated short distance before requiring BSC - pt reports he had had an enema earlier this am. Pt with nurses light in hand and CNA made aware.  Will follow and progress as tolerated.  Pt should progress to dc home with family assist.        If plan is discharge home, recommend the following: A little help with walking and/or transfers;A little help with bathing/dressing/bathroom;Assistance with cooking/housework;Assist for transportation;Help with stairs or ramp for entrance   Can travel by private vehicle        Equipment Recommendations None recommended by PT  Recommendations for Other Services       Functional Status Assessment Patient has had a recent decline in their functional status and demonstrates the ability to make significant improvements in function in a reasonable and predictable amount of time.     Precautions / Restrictions Precautions Precautions: Fall Precaution/Restrictions Comments: s/p abdominal surgery and with NG tube in place Restrictions Weight Bearing Restrictions Per Provider Order: No      Mobility  Bed Mobility               General bed mobility comments: NT - pt in recliner initially and on BSC at session end    Transfers Overall transfer level: Needs assistance Equipment used: Rolling walker (2 wheels) Transfers: Sit to/from Stand Sit to Stand: Min assist           General transfer comment: Steady assist wtih cues for use of UEs  to self assist    Ambulation/Gait Ambulation/Gait assistance: Min assist Gait Distance (Feet): 6 Feet Assistive device: Rolling walker (2 wheels) Gait Pattern/deviations: Step-to pattern, Decreased step length - right, Decreased step length - left, Shuffle, Trunk flexed       General Gait Details: cues for posture and position from RW; distance ltd by need for [facility] - pt reveals he had had an enema earlier this am  Stairs            Wheelchair Mobility     Tilt Bed    Modified Rankin (Stroke Patients Only)       Balance Overall balance assessment: Needs assistance Sitting-balance support: No upper extremity supported, Feet supported Sitting balance-Leahy Scale: Good     Standing balance support: No upper extremity supported Standing balance-Leahy Scale: Fair                               Pertinent Vitals/Pain Pain Assessment Pain Assessment: Faces Faces Pain Scale: Hurts even more Pain Location: abdominal pain with coughing and wretching (after magic mouthwash) Pain Descriptors / Indicators: Grimacing, Guarding, Cramping Pain Intervention(s): Limited activity within patient's tolerance, Monitored during session    Home Living Family/patient expects to be discharged to:: Private residence Living Arrangements: Spouse/significant other Available Help at Discharge: Family;Available 24 hours/day Type of Home: House Home Access: Stairs to enter Entrance Stairs-Rails: Lawyer of Steps: 4   Home Layout: One level Home Equipment: Agricultural Consultant (2 wheels)  Prior Function Prior Level of Function : Independent/Modified Independent                     Extremity/Trunk Assessment   Upper Extremity Assessment Upper Extremity Assessment: Overall WFL for tasks assessed    Lower Extremity Assessment Lower Extremity Assessment: Generalized weakness       Communication   Communication Communication: No apparent  difficulties    Cognition Arousal: Alert Behavior During Therapy: WFL for tasks assessed/performed   PT - Cognitive impairments: No apparent impairments                         Following commands: Intact       Cueing Cueing Techniques: Verbal cues     General Comments      Exercises     Assessment/Plan    PT Assessment Patient needs continued PT services  PT Problem List Decreased strength;Decreased activity tolerance;Decreased balance;Decreased mobility;Decreased knowledge of use of DME;Pain       PT Treatment Interventions DME instruction;Gait training;Stair training;Functional mobility training;Therapeutic activities;Therapeutic exercise;Balance training;Patient/family education    PT Goals (Current goals can be found in the Care Plan section)  Acute Rehab PT Goals Patient Stated Goal: Regain IND; decreased pain PT Goal Formulation: With patient Time For Goal Achievement: 01/15/25 Potential to Achieve Goals: Good    Frequency Min 3X/week     Co-evaluation               AM-PAC PT 6 Clicks Mobility  Outcome Measure Help needed turning from your back to your side while in a flat bed without using bedrails?: A Little Help needed moving from lying on your back to sitting on the side of a flat bed without using bedrails?: A Little Help needed moving to and from a bed to a chair (including a wheelchair)?: A Little Help needed standing up from a chair using your arms (e.g., wheelchair or bedside chair)?: A Little Help needed to walk in hospital room?: A Lot Help needed climbing 3-5 steps with a railing? : A Lot 6 Click Score: 16    End of Session Equipment Utilized During Treatment: Gait belt Activity Tolerance: Patient tolerated treatment well Patient left: Other (comment) (on [facility]) Nurse Communication: Mobility status PT Visit Diagnosis: Difficulty in walking, not elsewhere classified (R26.2)    Time: 8891-8871 PT Time Calculation (min)  (ACUTE ONLY): 20 min   Charges:   PT Evaluation $PT Eval Low Complexity: 1 Low   PT General Charges $$ ACUTE PT VISIT: 1 Visit         [facility] PT Acute Rehabilitation Services Office [phone]   Zurii Hewes 01/01/2025, 12:38 PM

## 2025-01-01 NOTE — Progress Notes (Signed)
 01/01/2025  Timothy Peterson [id number] 07-29-1946  CARE TEAM: PCP: [facility]  Outpatient Care Team: Patient Care Team: [facility] as PCP - Diedre Sheldon Standing, MD as Consulting Physician (Colon and Rectal Surgery) Mansouraty, Aloha Raddle., MD as Consulting Physician (Gastroenterology)  Inpatient Treatment Team: Treatment Team:  Raenelle Coria, MD Ccs, Md, MD Bobbette File, MD Doner, Chrissie E, RN O'Donohue, Powell LABOR, RN Harl Katrinka SQUIBB, PT Mbanwei, Mabel E, RN   Problem List:   Principal Problem:   Abdominal pain Active Problems:   Ileitis   ARF (acute renal failure)   Essential hypertension   HLD (hyperlipidemia)   Atrial flutter (HCC)   Small bowel obstruction (HCC)   Foreign body in small intestine   Stricture of small intestine (HCC)   12/30/2024  POST-OPERATIVE DIAGNOSIS:   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  Ileocecal valve seemed to have some sparing but retained capsule near the ileocecal region.  Ileocolectomy done.   It is an  ileocolonic anastomosis (mid distal ileum to proximal ascending colon ) that rests in the right lower quadrant.   Umbilical hernia 1cm incarcerated with omentum.  Reduced.  Primary suture repair done    Assessment [facility] Stay = 5 days) 2 Days Post-Op    Postop ileus    Plan:  NG tube decompression  IV fluids.  Usually we try to keep on the dry side.  However elevated creatinine and not great urine output so may need to catch up with IV fluid bolus.  Awaiting labs to make that  determination.  See what medicine thinks.   Awaiting pathology.  Strongly suspect Crohn's disease.  We will see.  -monitor electrolytes & replace as needed  Keep K>4, Mg>2, Phos>3  -VTE prophylaxis- SCDs.  Anticoagulation prophyllaxis SQ as appropriate  -mobilize as tolerated to help recovery.  Enlist therapies in moderate/high risk patients as appropriate.  Given overly deconditioned state agree with physical therapy evaluation.  I updated the patient's status to the patient  Recommendations were made.  Questions were answered.  He expressed understanding & appreciation.  -Disposition: TBD.  Given his chronic obstruction suspect ileus will be a while.  If has flatus with bowel function low NG tube output, I would err on the side of doing a clamping trial with liquids for the day to check residuals.  If that goes well remove NG tube and advance diet.     I reviewed nursing notes, hospitalist notes, last 24 h vitals and pain scores, last 48 h intake and output, last 24 h labs and trends, and last 24 h imaging results.  I have reviewed this patient's available data, including medical history, events of note, test results, etc as part of my evaluation.   A significant portion of that time was spent in counseling. Care during the described time interval was provided by me.  This care required moderate level of medical decision making.  01/01/2025    Subjective: (Chief complaint)  Sore but medicines helping.  Complaining of sore throat.    Objective:  Vital signs:  Vitals:   12/31/24 0500 12/31/24 1616 12/31/24 1957 01/01/25 0407  BP:  (!) 144/58 (!) 145/61 (!) 143/64  Pulse:  94 99 95  Resp:  16 19 19   Temp:  98.6 F (37 C) 98.5 F (36.9 C) 98.1 F (36.7 C)  TempSrc:  Oral Oral Oral  SpO2:  (!) 88% (!) 89% 96%  Weight: 83.7 kg   80.6 kg  Height:        Last BM Date : 12/24/24  Intake/Output   Yesterday:  01/16 0701 - 01/17 0700 In: 1314.5 [P.O.:120; I.V.:894.5;  IV Piggyback:300] Out: 1020 [Urine:520; Emesis/NG output:500] This shift:  No intake/output data recorded.  Bowel function:  Flatus: No  BM:  No  Drain: Nasogastric tube with thick green effluent in canister.  I flushed and unclogged.   Physical Exam:  General: Pt awake/alert in no acute distress.  Tired but not toxic/sickly Eyes: PERRL, normal EOM.  Sclera clear.  No icterus Neuro: CN II-XII intact w/o focal sensory/motor deficits. Lymph: No head/neck/groin lymphadenopathy Psych:  No delerium/psychosis/paranoia.  Oriented x 4 HENT: Normocephalic, Mucus membranes moist.  No thrush Neck: Supple, No tracheal deviation.  No obvious thyromegaly Chest: No pain to chest wall compression.  Good respiratory excursion.  No audible wheezing CV:  Pulses intact.  Regular rhythm.  No major extremity edema MS: Normal AROM mjr joints.  No obvious deformity  Abdomen: Soft.  Moderately distended.  Mildly tender at incisions only.  No evidence of peritonitis.  No incarcerated hernias.  Ext:  No deformity.  No mjr edema.  No cyanosis Skin: No petechiae / purpurea.  No major sores.  Warm and dry    Results:   Cultures: No results found for this or any previous visit (from the past 720 hours).  Labs: Results for orders placed or performed during the hospital encounter of 12/27/24 (from the past 48 hours)  CBC     Status: Abnormal   Collection Time: 12/31/24  4:53 AM  Result Value Ref Range   WBC 7.3 4.0 - 10.5 K/uL   RBC 2.97 (L) 4.22 - 5.81 MIL/uL   Hemoglobin 10.1 (L) 13.0 - 17.0 g/dL   HCT 68.8 (L) 60.9 - 47.9 %   MCV 104.7 (H) 80.0 - 100.0 fL   MCH 34.0 26.0 - 34.0 pg   MCHC 32.5 30.0 - 36.0 g/dL   RDW 84.8 88.4 - 84.4 %   Platelets 114 (L) 150 - 400 K/uL   nRBC 0.0 0.0 - 0.2 %    Comment: Performed at [facility], [address], [place], [place] [address]  Basic metabolic panel with GFR     Status: Abnormal   Collection Time: 12/31/24  8:42 AM  Result Value  Ref Range   Sodium 146 (H) 135 - 145 mmol/L    Comment: Delta check noted    Potassium 5.0 3.5 - 5.1 mmol/L    Comment: Delta check noted    Chloride 112 (H) 98 - 111 mmol/L   CO2 21 (L) 22 - 32 mmol/L   Glucose, Bld 188 (H) 70 - 99 mg/dL    Comment: Glucose reference range applies only to samples taken after fasting for at least 8 hours.   BUN 35 (H) 8 - 23 mg/dL   Creatinine, Ser 8.35 (H) 0.61 - 1.24 mg/dL   Calcium 8.7 (L) 8.9 - 10.3 mg/dL   GFR, Estimated 43 (L) >60 mL/min    Comment: (NOTE) Calculated using the CKD-EPI Creatinine Equation (2021)    Anion gap 12 5 - 15  Comment: Performed at [facility], [address] [place], [place] [address]  Magnesium     Status: Abnormal   Collection Time: 12/31/24  8:42 AM  Result Value Ref Range   Magnesium 2.6 (H) 1.7 - 2.4 mg/dL    Comment: Performed at [facility], [address], [place], [place] [address]  CBC     Status: Abnormal   Collection Time: 01/01/25  4:32 AM  Result Value Ref Range   WBC 4.7 4.0 - 10.5 K/uL   RBC 3.09 (L) 4.22 - 5.81 MIL/uL   Hemoglobin 10.5 (L) 13.0 - 17.0 g/dL   HCT 67.2 (L) 60.9 - 47.9 %   MCV 105.8 (H) 80.0 - 100.0 fL   MCH 34.0 26.0 - 34.0 pg   MCHC 32.1 30.0 - 36.0 g/dL   RDW 85.1 88.4 - 84.4 %   Platelets 101 (L) 150 - 400 K/uL   nRBC 0.0 0.0 - 0.2 %    Comment: Performed at [facility], [address], [place], [place] [address]    Imaging / Studies: DG Abd 1 View Result Date: 12/31/2024 EXAM: 1 VIEW XRAY OF THE ABDOMEN 12/31/2024 10:55:00 PM COMPARISON: 12/30/2024 CLINICAL HISTORY: [id number] Encounter for nasogastric tube placement [id number] Encounter for nasogastric tube placement [id number] Encounter for nasogastric tube placement [id number] Encounter for nasogastric tube placement [id number] Encounter for nasogastric tube placement [id number] FINDINGS: LINES, TUBES AND DEVICES: Enteric tube in place with distal tip and side port  terminating within the expected location of the gastric body. BOWEL: Dilated loops of small bowel measuring at least 4.2 cm in diameter, similar to the prior study, consistent with small bowel obstruction. SOFT TISSUES: No abnormal calcifications. BONES: No acute fracture. LUNGS: Partially visualized opacity within the right lung base suspicious for pneumonia. IMPRESSION: 1. Enteric tube in place with distal tip and side port terminating within the expected location of the gastric body. 2. Small bowel obstruction, similar to the prior study. 3. Partially visualized opacity within the right lung base suspicious for pneumonia. Electronically signed by: Oneil Devonshire MD 12/31/2024 10:58 PM EST RP Workstation: [id number]    Medications / Allergies: per chart  Antibiotics: Anti-infectives (From admission, onward)    Start     Dose/Rate Route Frequency Ordered Stop   12/30/24 2000  cefoTEtan  (CEFOTAN ) 2 g in sodium chloride  0.9 % 100 mL IVPB        2 g 200 mL/hr over 30 Minutes Intravenous Every 12 hours 12/30/24 1149 12/30/24 2159   12/30/24 0600  cefoTEtan  (CEFOTAN ) 2 g in sodium chloride  0.9 % 100 mL IVPB        2 g 200 mL/hr over 30 Minutes Intravenous On call to O.R. 12/29/24 1338 12/30/24 0805         Note: Portions of this report may have been transcribed using voice recognition software. Every effort was made to ensure accuracy; however, inadvertent computerized transcription errors may be present.   Any transcriptional errors that result from this process are unintentional.    Elspeth KYM Schultze, MD, FACS, MASCRS Esophageal, Gastrointestinal & Colorectal Surgery Robotic and Minimally Invasive Surgery  [facility] [address] [place], [place] [address] [phone] Fax [phone] Main  CONTACT INFORMATION: Weekday (9AM-5PM): Call [facility] main office at [phone] Weeknight (5PM-9AM) or Weekend/Holiday: Check EPIC  Web Links tab & use AMION (password  [facility]) for General Surgery [facility] coverage  Please, DO  NOT use SecureChat  (it is not reliable communication to reach operating surgeons & will lead to a delay in care).   Epic staff messaging available for outpatient concerns needing 1-2 business day response.      01/01/2025  7:42 AM

## 2025-01-01 NOTE — Plan of Care (Signed)
" °  Problem: Education: Goal: Knowledge of General Education information will improve Description: Including pain rating scale, medication(s)/side effects and non-pharmacologic comfort measures Outcome: Progressing   Problem: Clinical Measurements: Goal: Ability to maintain clinical measurements within normal limits will improve Outcome: Progressing Goal: Will remain free from infection Outcome: Progressing Goal: Cardiovascular complication will be avoided Outcome: Progressing   Problem: Activity: Goal: Risk for activity intolerance will decrease Outcome: Progressing   Problem: Elimination: Goal: Will not experience complications related to urinary retention Outcome: Progressing   Problem: Pain Managment: Goal: General experience of comfort will improve and/or be controlled Outcome: Progressing   Problem: Safety: Goal: Ability to remain free from injury will improve Outcome: Progressing   Problem: Neurological: Goal: Will regain or maintain usual level of consciousness Outcome: Progressing   Problem: Urinary Elimination: Goal: Will remain free from infection Outcome: Progressing   "

## 2025-01-02 ENCOUNTER — Inpatient Hospital Stay (HOSPITAL_COMMUNITY)

## 2025-01-02 DIAGNOSIS — K56609 Unspecified intestinal obstruction, unspecified as to partial versus complete obstruction: Secondary | ICD-10-CM | POA: Diagnosis not present

## 2025-01-02 DIAGNOSIS — K50012 Crohn's disease of small intestine with intestinal obstruction: Secondary | ICD-10-CM | POA: Diagnosis not present

## 2025-01-02 LAB — BASIC METABOLIC PANEL WITH GFR
Anion gap: 10 (ref 5–15)
BUN: 29 mg/dL — ABNORMAL HIGH (ref 8–23)
CO2: 23 mmol/L (ref 22–32)
Calcium: 8.4 mg/dL — ABNORMAL LOW (ref 8.9–10.3)
Chloride: 118 mmol/L — ABNORMAL HIGH (ref 98–111)
Creatinine, Ser: 1.38 mg/dL — ABNORMAL HIGH (ref 0.61–1.24)
GFR, Estimated: 53 mL/min — ABNORMAL LOW
Glucose, Bld: 177 mg/dL — ABNORMAL HIGH (ref 70–99)
Potassium: 4.3 mmol/L (ref 3.5–5.1)
Sodium: 151 mmol/L — ABNORMAL HIGH (ref 135–145)

## 2025-01-02 LAB — CBC WITH DIFFERENTIAL/PLATELET
Abs Immature Granulocytes: 0.06 K/uL (ref 0.00–0.07)
Basophils Absolute: 0 K/uL (ref 0.0–0.1)
Basophils Relative: 0 %
Eosinophils Absolute: 0 K/uL (ref 0.0–0.5)
Eosinophils Relative: 0 %
HCT: 28.5 % — ABNORMAL LOW (ref 39.0–52.0)
Hemoglobin: 9.2 g/dL — ABNORMAL LOW (ref 13.0–17.0)
Immature Granulocytes: 1 %
Lymphocytes Relative: 5 %
Lymphs Abs: 0.4 K/uL — ABNORMAL LOW (ref 0.7–4.0)
MCH: 33.8 pg (ref 26.0–34.0)
MCHC: 32.3 g/dL (ref 30.0–36.0)
MCV: 104.8 fL — ABNORMAL HIGH (ref 80.0–100.0)
Monocytes Absolute: 1.1 K/uL — ABNORMAL HIGH (ref 0.1–1.0)
Monocytes Relative: 13 %
Neutro Abs: 6.7 K/uL (ref 1.7–7.7)
Neutrophils Relative %: 81 %
Platelets: 105 K/uL — ABNORMAL LOW (ref 150–400)
RBC: 2.72 MIL/uL — ABNORMAL LOW (ref 4.22–5.81)
RDW: 14.7 % (ref 11.5–15.5)
WBC: 8.3 K/uL (ref 4.0–10.5)
nRBC: 0 % (ref 0.0–0.2)

## 2025-01-02 LAB — MAGNESIUM: Magnesium: 2.7 mg/dL — ABNORMAL HIGH (ref 1.7–2.4)

## 2025-01-02 LAB — GLUCOSE, CAPILLARY: Glucose-Capillary: 154 mg/dL — ABNORMAL HIGH (ref 70–99)

## 2025-01-02 MED ORDER — DEXTROSE 5 % IV SOLN: INTRAVENOUS | Status: DC

## 2025-01-02 NOTE — Plan of Care (Signed)
" °  Problem: Education: Goal: Knowledge of General Education information will improve Description: Including pain rating scale, medication(s)/side effects and non-pharmacologic comfort measures Outcome: Progressing   Problem: Health Behavior/Discharge Planning: Goal: Ability to manage health-related needs will improve Outcome: Progressing   Problem: Clinical Measurements: Goal: Ability to maintain clinical measurements within normal limits will improve Outcome: Progressing Goal: Will remain free from infection Outcome: Progressing Goal: Diagnostic test results will improve Outcome: Progressing Goal: Respiratory complications will improve Outcome: Progressing Goal: Cardiovascular complication will be avoided Outcome: Progressing   Problem: Activity: Goal: Risk for activity intolerance will decrease Outcome: Progressing   Problem: Nutrition: Goal: Adequate nutrition will be maintained Outcome: Progressing   Problem: Coping: Goal: Level of anxiety will decrease Outcome: Progressing   Problem: Elimination: Goal: Will not experience complications related to bowel motility Outcome: Progressing Goal: Will not experience complications related to urinary retention Outcome: Progressing   Problem: Pain Managment: Goal: General experience of comfort will improve and/or be controlled Outcome: Progressing   Problem: Safety: Goal: Ability to remain free from injury will improve Outcome: Progressing   Problem: Skin Integrity: Goal: Risk for impaired skin integrity will decrease Outcome: Progressing   Problem: Education: Goal: Knowledge of the prescribed therapeutic regimen will improve Outcome: Progressing   Problem: Bowel/Gastric: Goal: Gastrointestinal status for postoperative course will improve Outcome: Progressing   Problem: Cardiac: Goal: Ability to maintain an adequate cardiac output Outcome: Progressing Goal: Will show no evidence of cardiac arrhythmias Outcome:  Progressing   Problem: Nutritional: Goal: Will attain and maintain optimal nutritional status Outcome: Progressing   Problem: Neurological: Goal: Will regain or maintain usual level of consciousness Outcome: Progressing   Problem: Clinical Measurements: Goal: Ability to maintain clinical measurements within normal limits Outcome: Progressing Goal: Postoperative complications will be avoided or minimized Outcome: Progressing   Problem: Respiratory: Goal: Will regain and/or maintain adequate ventilation Outcome: Progressing Goal: Respiratory status will improve Outcome: Progressing   Problem: Skin Integrity: Goal: Demonstrates signs of wound healing without infection Outcome: Progressing   Problem: Urinary Elimination: Goal: Will remain free from infection Outcome: Progressing Goal: Ability to achieve and maintain adequate urine output Outcome: Progressing   Problem: Education: Goal: Knowledge of the prescribed therapeutic regimen will improve Outcome: Progressing   Problem: Bowel/Gastric: Goal: Gastrointestinal status for postoperative course will improve Outcome: Progressing   Problem: Cardiac: Goal: Ability to maintain an adequate cardiac output Outcome: Progressing Goal: Will show no evidence of cardiac arrhythmias Outcome: Progressing   Problem: Nutritional: Goal: Will attain and maintain optimal nutritional status Outcome: Progressing   Problem: Neurological: Goal: Will regain or maintain usual level of consciousness Outcome: Progressing   Problem: Clinical Measurements: Goal: Ability to maintain clinical measurements within normal limits Outcome: Progressing Goal: Postoperative complications will be avoided or minimized Outcome: Progressing   Problem: Respiratory: Goal: Will regain and/or maintain adequate ventilation Outcome: Progressing Goal: Respiratory status will improve Outcome: Progressing   Problem: Skin Integrity: Goal: Demonstrates  signs of wound healing without infection Outcome: Progressing   Problem: Urinary Elimination: Goal: Will remain free from infection Outcome: Progressing Goal: Ability to achieve and maintain adequate urine output Outcome: Progressing   "

## 2025-01-02 NOTE — Progress Notes (Signed)
 Physical Therapy Treatment Patient Details Name: Timothy Peterson MRN: [id number] DOB: 11/20/1946 Today's Date: 01/02/2025   History of Present Illness Pt s/p laparoscopic partial colectomy 2* SBO and with incarcerated umbilical hernia repair performed 12/30/24.  Pt with hx of htn and gouty arthritis    PT Comments  Pt continues very cooperative this am and with noted improvement in activity tolerance.  Pt educated on log roll OOB, up to [facility] for toileting and up to ambulate in hallway with RW.  Pt currently requiring min assist for safety and stability but pleased with progress.  Pt reports fall overnight with attempt to get OOB 2* urgent need for bathroom.   If plan is discharge home, recommend the following: A little help with walking and/or transfers;A little help with bathing/dressing/bathroom;Assistance with cooking/housework;Assist for transportation;Help with stairs or ramp for entrance   Can travel by private vehicle        Equipment Recommendations  None recommended by PT    Recommendations for Other Services       Precautions / Restrictions Precautions Precautions: Fall Precaution/Restrictions Comments: s/p abdominal surgery and with NG tube in place Restrictions Weight Bearing Restrictions Per Provider Order: No     Mobility  Bed Mobility Overal bed mobility: Needs Assistance Bed Mobility: Rolling, Sidelying to Sit Rolling: Min assist Sidelying to sit: Min assist       General bed mobility comments: Increased time with cues for proper log roll technique    Transfers Overall transfer level: Needs assistance Equipment used: Rolling walker (2 wheels) Transfers: Sit to/from Stand, Bed to chair/wheelchair/BSC Sit to Stand: Min assist   Step pivot transfers: Min assist       General transfer comment: Steady assist wtih cues for use of UEs to self assist; step pvt bed to [facility]    Ambulation/Gait Ambulation/Gait assistance: Min assist Gait Distance (Feet): 160  Feet Assistive device: Rolling walker (2 wheels) Gait Pattern/deviations: Step-through pattern, Decreased step length - right, Decreased step length - left, Shuffle, Trunk flexed Gait velocity: decr     General Gait Details: Increased time with cues for posture and position from [employer]             Wheelchair Mobility     Tilt Bed    Modified Rankin (Stroke Patients Only)       Balance Overall balance assessment: Needs assistance Sitting-balance support: No upper extremity supported, Feet supported Sitting balance-Leahy Scale: Good     Standing balance support: No upper extremity supported Standing balance-Leahy Scale: Fair                              Hotel Manager: No apparent difficulties  Cognition Arousal: Alert Behavior During Therapy: WFL for tasks assessed/performed   PT - Cognitive impairments: No apparent impairments                         Following commands: Intact      Cueing Cueing Techniques: Verbal cues  Exercises      General Comments        Pertinent Vitals/Pain Pain Assessment Pain Assessment: Faces Faces Pain Scale: Hurts little more Pain Location: abdominal pain with coughing Pain Descriptors / Indicators: Grimacing, Guarding Pain Intervention(s): Limited activity within patient's tolerance, Monitored during session, Premedicated before session, Ice applied    Home Living  Prior Function            PT Goals (current goals can now be found in the care plan section) Acute Rehab PT Goals Patient Stated Goal: Regain IND; decreased pain PT Goal Formulation: With patient Time For Goal Achievement: 01/15/25 Potential to Achieve Goals: Good Progress towards PT goals: Progressing toward goals    Frequency    Min 3X/week      PT Plan      Co-evaluation              AM-PAC PT 6 Clicks Mobility   Outcome Measure  Help  needed turning from your back to your side while in a flat bed without using bedrails?: A Little Help needed moving from lying on your back to sitting on the side of a flat bed without using bedrails?: A Little Help needed moving to and from a bed to a chair (including a wheelchair)?: A Little Help needed standing up from a chair using your arms (e.g., wheelchair or bedside chair)?: A Little Help needed to walk in hospital room?: A Little Help needed climbing 3-5 steps with a railing? : A Lot 6 Click Score: 17    End of Session Equipment Utilized During Treatment: Gait belt Activity Tolerance: Patient tolerated treatment well Patient left: in chair;with call bell/phone within reach;with chair alarm set Nurse Communication: Mobility status PT Visit Diagnosis: Difficulty in walking, not elsewhere classified (R26.2)     Time: 8853-8784 PT Time Calculation (min) (ACUTE ONLY): 29 min  Charges:    $Gait Training: 8-22 mins $Therapeutic Activity: 8-22 mins PT General Charges $$ ACUTE PT VISIT: 1 Visit                     [facility] PT Acute Rehabilitation Services Office [phone]    Adrean Heitz 01/02/2025, 12:44 PM

## 2025-01-02 NOTE — Progress Notes (Signed)
 Patient found on the floor next to his bedside commode. VSS. Blood glucose WNL. Patient stated he did not know if he hit his head or not. Neuro assessment WDL.Skin intact. No injuries observed. Provider notified. Stat CT ordered.    01/02/25 0102  What Happened  Was fall witnessed? No  Was patient injured? Unsure  Patient found on floor  Found by Staff-comment Deniece RN)  Stated prior activity to/from bed, chair, or stretcher  Provider Notification  Provider Name/Title [place] NP  Date Provider Notified 01/02/25  Method of Notification Page  Notification Reason Fall  Provider response See new orders  Date of Provider Response 01/02/25  Follow Up  Family notified Yes - comment  Additional tests Yes-comment (head CT)  Progress note created (see row info) Yes  Adult Fall Risk Assessment  Risk Factor Category (scoring not indicated) Fall has occurred during this admission (document High fall risk)  Age 79  Fall History: Fall within 6 months prior to admission 0  Elimination; Bowel and/or Urine Incontinence 0  Elimination; Bowel and/or Urine Urgency/Frequency 2  Medications: includes PCA/Opiates, Anti-convulsants, Anti-hypertensives, Diuretics, Hypnotics, Laxatives, Sedatives, and Psychotropics 3  Patient Care Equipment 3  Mobility-Assistance 2  Mobility-Gait 2  Mobility-Sensory Deficit 0  Altered awareness of immediate physical environment 0  Impulsiveness 2  Lack of understanding of one's physical/cognitive limitations 4  Total Score 20  Patient Fall Risk Level High fall risk  Adult Fall Risk Interventions  Required Bundle Interventions *See Row Information* High fall risk  Additional Interventions Use of appropriate toileting equipment (bedpan, BSC, etc.)  Screening for Fall Injury Risk (To be completed on HIGH fall risk patients) - Assessing Need for Floor Mats  Risk For Fall Injury- Criteria for Floor Mats Noncompliant with safety precautions  Will Implement Floor Mats Yes   Vitals  BP (!) 166/69  MAP (mmHg) 95  BP Location Left Arm  BP Method Automatic  Patient Position (if appropriate) Sitting  Pulse Rate (!) 122  Pulse Rate Source Monitor  Resp 20  Oxygen Therapy  SpO2 96 %  Pain Assessment  Pain Scale 0-10  Pain Score 0  Neurological  Neuro (WDL) WDL  Level of Consciousness Alert  Orientation Level Oriented X4  Glasgow Coma Scale  Eye Opening 4  Best Verbal Response (NON-intubated) 5  Best Motor Response 6  Glasgow Coma Scale Score 15  Musculoskeletal  Musculoskeletal (WDL) WDL  Integumentary  Integumentary (WDL) WDL

## 2025-01-02 NOTE — Progress Notes (Signed)
 " PROGRESS NOTE    Haniel Fix  FMW:[id number] DOB: Feb 04, 1946 DOA: 12/27/2024 PCP: [facility]    Brief Narrative:  79 year old with history of hypertension, hyperlipidemia, A-flutter on Eliquis  who has been followed by gastroenterology for the last several months with history of diarrhea and enteritis.  Patient also reported 20 pound weight loss last 5 years.  Recent EGD and colonoscopy, capsule endoscopy on 1/9.  After about 24 hours of capsule endoscopy patient started having lower abdominal discomfort with nausea hiccups and burping.  In the emergency room hemodynamically stable.  Complaining of abdominal pain relieved with morphine .  Creatinine 1.6 with recent creatinine of 1.1.  CT scan abdomen pelvis with high-grade small bowel obstruction, capsule endoscopy still at the ileum.  Admitted with GI and surgery consultation. Patient did not improve with conservative management. 1/15, underwent open surgical ileocolectomy and anastomosis.   Subjective:  Patient seen and examined.  Overnight he lost his balance and fell on the floor.  Discussions have been negative.  Head CT was negative.  Patient had been passing flatus and he had 1 bowel movement yesterday.  Surgery challenging him with clears. A lot of throat discomfort and coughing with NG tube present.  He is very much looking forward to get rid of NG tube.   Assessment & Plan:   Partial small bowel obstruction, high-grade small bowel obstruction. 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 Did not improve with conservative management.  Underwent surgical resection and anastomosis.  Day 3 postop today.  GI and surgery following.  Biopsies pending.  Postop management as per surgery. Challenge with clears today.  Mobilize.  Pain control.  AKI from dehydration: Kidney function improved.  Sodium and chloride remains elevated.  With free water  deficit, will treat him with dextrose  for next 24  hours. Urinating adequate. Encouraged oral water  intake.  paroxysmal A-flutter: Patient is currently in sinus rhythm.  He is not on any rate control medications.  Was Therapeutic on Eliquis .  Will start Eliquis  when cleared by surgery.  Essential hypertension: Blood pressures stable.  Lisinopril  on hold.  As needed medications.  Hyperlipidemia: On statins.  Hold until oral intake.    DVT prophylaxis: enoxaparin  (LOVENOX ) injection 40 mg Start: 12/31/24 0800 SCDs Start: 12/27/24 2245 heparin  infusion   Code Status: Full code Family Communication: None at the bedside Disposition Plan: Status is: Inpatient Remains inpatient appropriate because: Significant symptoms, diet tolerance.  Immediate postop.     Consultants:  Gastroenterology General Surgery  Procedures:  Open ileo-colectomy and anstomosis   Antimicrobials:  Preoperative.     Objective: Vitals:   01/02/25 0151 01/02/25 0403 01/02/25 0452 01/02/25 0700  BP: (!) 146/58 (!) 149/48 (!) 136/51   Pulse: 95 94 83   Resp: 18 18 20    Temp: 98.5 F (36.9 C) 98.4 F (36.9 C) 98.5 F (36.9 C)   TempSrc: Oral Oral Oral   SpO2: 93% 95% 95%   Weight:    66.3 kg  Height:        Intake/Output Summary (Last 24 hours) at 01/02/2025 1150 Last data filed at 01/02/2025 1058 Gross per 24 hour  Intake 556.26 ml  Output 1150 ml  Net -593.74 ml   Filed Weights   12/31/24 0500 01/01/25 0407 01/02/25 0700  Weight: 83.7 kg 80.6 kg 66.3 kg    Examination:  General: Slightly anxious.  Throat clearing.  On room air.  Not in any discomfort. Cardiovascular: S1-S2 normal.  Regular rate rhythm. Respiratory: Bilateral  good air entry, a lot of conducted airway sounds from his throat. Gastrointestinal: Soft.  Mildly tender along the surgical incisions.  Bowel sound present. Ext: No swelling or edema. Neuro: Alert awake and oriented.  Neurologically intact. Musculoskeletal: No deformities.       Data Reviewed: I have  personally reviewed following labs and imaging studies  CBC: Recent Labs  Lab 12/29/24 0449 12/30/24 0448 12/31/24 0453 01/01/25 0432 01/02/25 0430  WBC 9.2 8.6 7.3 4.7 8.3  NEUTROABS  --   --   --   --  6.7  HGB 11.7* 11.0* 10.1* 10.5* 9.2*  HCT 34.3* 32.6* 31.1* 32.7* 28.5*  MCV 101.2* 101.2* 104.7* 105.8* 104.8*  PLT 113* 101* 114* 101* 105*   Basic Metabolic Panel: Recent Labs  Lab 12/28/24 0105 12/29/24 0449 12/31/24 0842 01/01/25 0805 01/02/25 0430  NA 135 140 146* 147* 151*  K 4.9 4.8 5.0 4.5 4.3  CL 106 107 112* 114* 118*  CO2 20* 18* 21* 24 23  GLUCOSE 126* 108* 188* 181* 177*  BUN 32* 31* 35* 28* 29*  CREATININE 1.64* 1.55* 1.64* 1.44* 1.38*  CALCIUM 9.1 9.2 8.7* 8.7* 8.4*  MG  --   --  2.6*  --  2.7*   GFR: Estimated Creatinine Clearance: 42 mL/min (A) (by C-G formula based on SCr of 1.38 mg/dL (H)). Liver Function Tests: Recent Labs  Lab 12/27/24 1430 12/28/24 0105  AST 20 18  ALT 17 13  ALKPHOS 117 103  BILITOT 0.7 0.8  PROT 7.0 6.3*  ALBUMIN  4.6 4.0   Recent Labs  Lab 12/27/24 1430  LIPASE 22   No results for input(s): AMMONIA in the last 168 hours. Coagulation Profile: No results for input(s): INR, PROTIME in the last 168 hours. Cardiac Enzymes: No results for input(s): CKTOTAL, CKMB, CKMBINDEX, TROPONINI in the last 168 hours. BNP (last 3 results) No results for input(s): PROBNP in the last 8760 hours. HbA1C: No results for input(s): HGBA1C in the last 72 hours. CBG: Recent Labs  Lab 01/02/25 0103  GLUCAP 154*   Lipid Profile: No results for input(s): CHOL, HDL, LDLCALC, TRIG, CHOLHDL, LDLDIRECT in the last 72 hours. Thyroid Function Tests: No results for input(s): TSH, T4TOTAL, FREET4, T3FREE, THYROIDAB in the last 72 hours. Anemia Panel: No results for input(s): VITAMINB12, FOLATE, FERRITIN, TIBC, IRON, RETICCTPCT in the last 72 hours. Sepsis Labs: No results for input(s):  PROCALCITON, LATICACIDVEN in the last 168 hours.  No results found for this or any previous visit (from the past 240 hours).       Radiology Studies: CT HEAD WO CONTRAST ( ) Result Date: 01/02/2025 EXAM: CT HEAD WITHOUT CONTRAST 01/02/2025 01:38:15 AM TECHNIQUE: CT of the head was performed without the administration of intravenous contrast. Automated exposure control, iterative reconstruction, and/or weight based adjustment of the mA/kV was utilized to reduce the radiation dose to as low as reasonably achievable. COMPARISON: MRI 01/29/2007. 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 No acute hemorrhage. No evidence of acute infarct. No hydrocephalus. No extra-axial collection. No mass effect or midline shift. ORBITS: No acute abnormality. SINUSES: No acute abnormality. SOFT TISSUES AND SKULL: No acute soft tissue abnormality. No skull fracture. IMPRESSION: 1. No acute intracranial abnormality. 2. 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 Electronically signed by: Franky Crease MD 01/02/2025 01:41 AM EST RP Workstation: [id number]   DG Abd 1 View Result Date: 12/31/2024 EXAM: 1 VIEW XRAY OF THE  ABDOMEN 12/31/2024 10:55:00 PM COMPARISON: 12/30/2024 CLINICAL HISTORY: [id number] Encounter for nasogastric tube placement [id number] Encounter for nasogastric tube placement [id number] Encounter for nasogastric tube placement [id number] Encounter for nasogastric tube placement [id number] Encounter for nasogastric tube placement [phone] FINDINGS: LINES, TUBES AND DEVICES: Enteric tube in place with distal tip and side port terminating within the expected location of the gastric body. BOWEL: Dilated loops of small bowel measuring at least 4.2 cm in diameter, similar to the prior study, consistent with small bowel obstruction. SOFT TISSUES: No abnormal calcifications. BONES: No acute  fracture. LUNGS: Partially visualized opacity within the right lung base suspicious for pneumonia. IMPRESSION: 1. Enteric tube in place with distal tip and side port terminating within the expected location of the gastric body. 2. Small bowel obstruction, similar to the prior study. 3. Partially visualized opacity within the right lung base suspicious for pneumonia. Electronically signed by: Oneil Devonshire MD 12/31/2024 10:58 PM EST RP Workstation: [id number]        Scheduled Meds:  bisacodyl   10 mg Rectal Daily   enoxaparin  (LOVENOX ) injection  40 mg Subcutaneous Q24H   magic mouthwash  15 mL Oral QID   methocarbamol  (ROBAXIN ) injection  1,000 mg Intravenous Q8H    Continuous Infusions:  dextrose  50 mL/hr at 01/02/25 1112     LOS: 6 days    Time spent: 35 minutes    Renato Applebaum, MD [facility]   "

## 2025-01-02 NOTE — Progress Notes (Signed)
 01/02/2025  Timothy Peterson [id number] 02/11/1946  CARE TEAM: PCP: [facility]  Outpatient Care Team: Patient Care Team: [facility] as PCP - Diedre Sheldon Standing, MD as Consulting Physician (Colon and Rectal Surgery) Mansouraty, Aloha Raddle., MD as Consulting Physician (Gastroenterology)  Inpatient Treatment Team: Treatment Team:  Raenelle Coria, MD [facility], Md, MD Bobbette File, MD Orlin Handing, RN Harl Katrinka SQUIBB, PT Nance Laneta BRAVO, [place] Delapp, Leita BIRCH, RN Jerona Lauraine BROCKS, RN   Problem List:   Principal Problem:   Abdominal pain Active Problems:   Ileitis   ARF (acute renal failure)   Essential hypertension   HLD (hyperlipidemia)   Atrial flutter (HCC)   Small bowel obstruction (HCC)   Foreign body in small intestine   Stricture of small intestine (HCC)   12/30/2024  POST-OPERATIVE DIAGNOSIS:   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  Ileocecal valve seemed to have some sparing but retained capsule near the ileocecal region.  Ileocolectomy done.   It is an  ileocolonic anastomosis (mid distal ileum to proximal ascending colon ) that rests in the right lower quadrant.   Umbilical hernia 1cm incarcerated with omentum.  Reduced.  Primary suture repair done    Assessment [facility] Stay = 6 days) 3 Days Post-Op    Postop ileus seems to be resolving.    Plan:  Will do NG tube clamping trial today with clear liquids.  If tolerates that can remove NG tube in the morning and advance to full liquid diet.  If has persistent or recurrent  symptoms of bloating or high residuals; then, placed back to suction and regroup   Hydration.  Switching to D5 given Hypernatremia.  Defer to medicine.  AKI resolving.  Awaiting pathology.  Strongly suspect Crohn's disease.  We will see.  -monitor electrolytes & replace as needed  Keep K>4, Mg>2, Phos>3  -VTE prophylaxis- SCDs.  Anticoagulation prophyllaxis SQ as appropriate  -mobilize as tolerated to help recovery.  Sundowning and fall precautions.  Physical therapy following.  Enlist therapies in moderate/high risk patients as appropriate.  Given overly deconditioned state agree with physical therapy evaluation.  I updated the patient's status to the patient  Recommendations were made.  Questions were answered.  He expressed understanding & appreciation.  -Disposition: Slowly improving.  NG tube clamping trial.  If does not improve or worsens by postop day 5; then, CT scan to rule out delayed abscess or other concerns.  Hopefully not too likely.  May need skilled nursing facility if does not improve.  See what therapies and primary service feel  I reviewed nursing notes, hospitalist notes, last 24 h vitals and pain scores, last 48 h intake and output, last 24 h labs and trends, and last 24 h imaging results.  I have reviewed this patient's available data, including medical history, events of note, test results, etc as part of my evaluation.   A significant portion of that time was spent in counseling. Care during the described time interval was provided by me.  This care required moderate level of medical decision making.  01/02/2025    Subjective: (Chief complaint)  Patient passing  some gas with less cramping.  Tired of NG tube.    Slipped when he tried to get out of bed to commode    Objective:  Vital signs:  Vitals:   01/02/25 0151 01/02/25 0403 01/02/25 0452 01/02/25 0700  BP: (!) 146/58 (!) 149/48 (!) 136/51   Pulse: 95 94 83   Resp: 18 18 20    Temp: 98.5 F (36.9  C) 98.4 F (36.9 C) 98.5 F (36.9 C)   TempSrc: Oral Oral Oral   SpO2: 93% 95% 95%   Weight:    66.3 kg  Height:        Last BM Date : 01/02/25  Intake/Output   Yesterday:  01/17 0701 - 01/18 0700 In: 556.3 [I.V.:556.3] Out: 550 [Urine:350; Emesis/NG output:200] This shift:  No intake/output data recorded.  Bowel function:  Flatus: YES  BM:  YES  Drain: Nasogastric tube with thick green effluent in canister.  I flushed and unclogged.   Physical Exam:  General: Pt awake/alert in no acute distress.  Tired but not toxic/sickly Eyes: PERRL, normal EOM.  Sclera clear.  No icterus Neuro: CN II-XII intact w/o focal sensory/motor deficits. Lymph: No head/neck/groin lymphadenopathy Psych:  No delerium/psychosis/paranoia.  Oriented x 4 HENT: Normocephalic, Mucus membranes moist.  No thrush Neck: Supple, No tracheal deviation.  No obvious thyromegaly Chest: No pain to chest wall compression.  Good respiratory excursion.  No audible wheezing CV:  Pulses intact.  Regular rhythm.  No major extremity edema MS: Normal AROM mjr joints.  No obvious deformity  Abdomen:  Soft.  Mildy distended.  Mildly tender at incisions only.  Incisions clean dry and intact without any drainage.  No cellulitis or ecchymosis. No evidence of peritonitis.  No incarcerated hernias.  Ext:  No deformity.  No mjr edema.  No cyanosis Skin: No petechiae / purpurea.  No major sores.  Warm and dry    Results:   Cultures: No results found for this or any previous visit (from the past 720 hours).  Labs: Results for orders placed or performed during the hospital encounter of 12/27/24 (from the past 48 hours)  Basic metabolic panel with GFR     Status: Abnormal   Collection Time: 12/31/24  8:42 AM  Result Value Ref Range   Sodium 146 (H) 135 - 145 mmol/L    Comment: Delta check noted    Potassium 5.0 3.5 - 5.1 mmol/L    Comment: Delta check noted    Chloride 112 (H) 98 - 111 mmol/L   CO2 21 (L) 22 - 32  mmol/L   Glucose, Bld 188 (H) 70 - 99 mg/dL    Comment: Glucose reference range applies only to samples taken after fasting for at least 8 hours.   BUN 35 (H) 8 - 23 mg/dL   Creatinine, Ser 8.35 (H) 0.61 - 1.24 mg/dL   Calcium 8.7 (L) 8.9 - 10.3 mg/dL   GFR, Estimated 43 (L) >60 mL/min    Comment: (NOTE) Calculated using the CKD-EPI Creatinine Equation (2021)    Anion gap 12 5 - 15    Comment: Performed at [facility], [address], [place], [place] [address]  Magnesium     Status: Abnormal   Collection Time: 12/31/24  8:42 AM  Result Value Ref Range   Magnesium 2.6 (H) 1.7 - 2.4 mg/dL    Comment: Performed at [facility], [address] [place], [place] [address]  CBC     Status: Abnormal  Collection Time: 01/01/25  4:32 AM  Result Value Ref Range   WBC 4.7 4.0 - 10.5 K/uL   RBC 3.09 (L) 4.22 - 5.81 MIL/uL   Hemoglobin 10.5 (L) 13.0 - 17.0 g/dL   HCT 67.2 (L) 60.9 - 47.9 %   MCV 105.8 (H) 80.0 - 100.0 fL   MCH 34.0 26.0 - 34.0 pg   MCHC 32.1 30.0 - 36.0 g/dL   RDW 85.1 88.4 - 84.4 %   Platelets 101 (L) 150 - 400 K/uL   nRBC 0.0 0.0 - 0.2 %    Comment: Performed at [facility], [address] [place], [place] [address]  Basic metabolic panel     Status: Abnormal   Collection Time: 01/01/25  8:05 AM  Result Value Ref Range   Sodium 147 (H) 135 - 145 mmol/L   Potassium 4.5 3.5 - 5.1 mmol/L   Chloride 114 (H) 98 - 111 mmol/L   CO2 24 22 - 32 mmol/L   Glucose, Bld 181 (H) 70 - 99 mg/dL    Comment: Glucose reference range applies only to samples taken after fasting for at least 8 hours.   BUN 28 (H) 8 - 23 mg/dL   Creatinine, Ser 8.55 (H) 0.61 - 1.24 mg/dL   Calcium 8.7 (L) 8.9 - 10.3 mg/dL   GFR, Estimated 50 (L) >60 mL/min    Comment: (NOTE) Calculated using the CKD-EPI Creatinine Equation (2021)    Anion gap 9 5 - 15    Comment: Performed at [facility], [address], [place],  [place] [address]  Glucose, capillary     Status: Abnormal   Collection Time: 01/02/25  1:03 AM  Result Value Ref Range   Glucose-Capillary 154 (H) 70 - 99 mg/dL    Comment: Glucose reference range applies only to samples taken after fasting for at least 8 hours.  CBC with Differential/Platelet     Status: Abnormal   Collection Time: 01/02/25  4:30 AM  Result Value Ref Range   WBC 8.3 4.0 - 10.5 K/uL   RBC 2.72 (L) 4.22 - 5.81 MIL/uL   Hemoglobin 9.2 (L) 13.0 - 17.0 g/dL   HCT 71.4 (L) 60.9 - 47.9 %   MCV 104.8 (H) 80.0 - 100.0 fL   MCH 33.8 26.0 - 34.0 pg   MCHC 32.3 30.0 - 36.0 g/dL   RDW 85.2 88.4 - 84.4 %   Platelets 105 (L) 150 - 400 K/uL    Comment: REPEATED TO VERIFY   nRBC 0.0 0.0 - 0.2 %   Neutrophils Relative % 81 %   Neutro Abs 6.7 1.7 - 7.7 K/uL   Lymphocytes Relative 5 %   Lymphs Abs 0.4 (L) 0.7 - 4.0 K/uL   Monocytes Relative 13 %   Monocytes Absolute 1.1 (H) 0.1 - 1.0 K/uL   Eosinophils Relative 0 %   Eosinophils Absolute 0.0 0.0 - 0.5 K/uL   Basophils Relative 0 %   Basophils Absolute 0.0 0.0 - 0.1 K/uL   WBC Morphology See Note     Comment: Mild Left Shift (1-5% metas, occ myelo)   Immature Granulocytes 1 %   Abs Immature Granulocytes 0.06 0.00 - 0.07 K/uL   Polychromasia PRESENT     Comment: Performed at [facility], [address] [place], [place] [address]  Basic metabolic panel with GFR     Status: Abnormal   Collection Time: 01/02/25  4:30 AM  Result Value Ref Range  Sodium 151 (H) 135 - 145 mmol/L   Potassium 4.3 3.5 - 5.1 mmol/L   Chloride 118 (H) 98 - 111 mmol/L   CO2 23 22 - 32 mmol/L   Glucose, Bld 177 (H) 70 - 99 mg/dL    Comment: Glucose reference range applies only to samples taken after fasting for at least 8 hours.   BUN 29 (H) 8 - 23 mg/dL   Creatinine, Ser 8.61 (H) 0.61 - 1.24 mg/dL   Calcium 8.4 (L) 8.9 - 10.3 mg/dL   GFR, Estimated 53 (L) >60 mL/min    Comment: (NOTE) Calculated using the CKD-EPI Creatinine Equation  (2021)    Anion gap 10 5 - 15    Comment: Performed at [facility], [address], [place], [place] [address]  Magnesium     Status: Abnormal   Collection Time: 01/02/25  4:30 AM  Result Value Ref Range   Magnesium 2.7 (H) 1.7 - 2.4 mg/dL    Comment: Performed at [facility], [address], [place], [place] [address]    Imaging / Studies: CT HEAD WO CONTRAST ( ) Result Date: 01/02/2025 EXAM: CT HEAD WITHOUT CONTRAST 01/02/2025 01:38:15 AM TECHNIQUE: CT of the head was performed without the administration of intravenous contrast. Automated exposure control, iterative reconstruction, and/or weight based adjustment of the mA/kV was utilized to reduce the radiation dose to as low as reasonably achievable. COMPARISON: MRI 01/29/2007. 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 No acute hemorrhage. No evidence of acute infarct. No hydrocephalus. No extra-axial collection. No mass effect or midline shift. ORBITS: No acute abnormality. SINUSES: No acute abnormality. SOFT TISSUES AND SKULL: No acute soft tissue abnormality. No skull fracture. IMPRESSION: 1. No acute intracranial abnormality. 2. 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 Electronically signed by: Franky Crease MD 01/02/2025 01:41 AM EST RP Workstation: [id number]   DG Abd 1 View Result Date: 12/31/2024 EXAM: 1 VIEW XRAY OF THE ABDOMEN 12/31/2024 10:55:00 PM COMPARISON: 12/30/2024 CLINICAL HISTORY: [id number] Encounter for nasogastric tube placement [id number] Encounter for nasogastric tube placement [id number] Encounter for nasogastric tube placement [id number] Encounter for nasogastric tube placement [id number] Encounter for nasogastric tube placement [phone] FINDINGS: LINES, TUBES AND DEVICES: Enteric tube in place with distal tip and side port terminating within the expected  location of the gastric body. BOWEL: Dilated loops of small bowel measuring at least 4.2 cm in diameter, similar to the prior study, consistent with small bowel obstruction. SOFT TISSUES: No abnormal calcifications. BONES: No acute fracture. LUNGS: Partially visualized opacity within the right lung base suspicious for pneumonia. IMPRESSION: 1. Enteric tube in place with distal tip and side port terminating within the expected location of the gastric body. 2. Small bowel obstruction, similar to the prior study. 3. Partially visualized opacity within the right lung base suspicious for pneumonia. Electronically signed by: Oneil Devonshire MD 12/31/2024 10:58 PM EST RP Workstation: [id number]    Medications / Allergies: per chart  Antibiotics: Anti-infectives (From admission, onward)    Start     Dose/Rate Route Frequency Ordered Stop   12/30/24 2000  cefoTEtan  (CEFOTAN ) 2 g in sodium chloride  0.9 % 100 mL IVPB        2 g 200 mL/hr over 30 Minutes Intravenous Every 12 hours 12/30/24 1149 12/30/24 2159   12/30/24 0600  cefoTEtan  (CEFOTAN ) 2 g in sodium chloride  0.9 % 100 mL IVPB  2 g 200 mL/hr over 30 Minutes Intravenous On call to O.R. 12/29/24 1338 12/30/24 0805         Note: Portions of this report may have been transcribed using voice recognition software. Every effort was made to ensure accuracy; however, inadvertent computerized transcription errors may be present.   Any transcriptional errors that result from this process are unintentional.    Elspeth KYM Schultze, MD, FACS, MASCRS Esophageal, Gastrointestinal & Colorectal Surgery Robotic and Minimally Invasive Surgery  [facility] [address] [place], [place] [address] [phone] Fax [phone] Main  CONTACT INFORMATION: Weekday (9AM-5PM): Call [facility] main office at [phone] Weeknight (5PM-9AM) or Weekend/Holiday: Check EPIC Web Links tab & use AMION  (password  [email or web address]) for General Surgery [facility] coverage  Please, DO NOT use SecureChat  (it is not reliable communication to reach operating surgeons & will lead to a delay in care).   Epic staff messaging available for outpatient concerns needing 1-2 business day response.      01/02/2025  8:08 AM

## 2025-01-03 ENCOUNTER — Encounter: Payer: Self-pay | Admitting: Gastroenterology

## 2025-01-03 DIAGNOSIS — K56609 Unspecified intestinal obstruction, unspecified as to partial versus complete obstruction: Secondary | ICD-10-CM | POA: Diagnosis not present

## 2025-01-03 DIAGNOSIS — K50012 Crohn's disease of small intestine with intestinal obstruction: Secondary | ICD-10-CM | POA: Diagnosis not present

## 2025-01-03 LAB — CBC WITH DIFFERENTIAL/PLATELET
Abs Immature Granulocytes: 0.07 K/uL (ref 0.00–0.07)
Basophils Absolute: 0 K/uL (ref 0.0–0.1)
Basophils Relative: 0 %
Eosinophils Absolute: 0.2 K/uL (ref 0.0–0.5)
Eosinophils Relative: 2 %
HCT: 29.8 % — ABNORMAL LOW (ref 39.0–52.0)
Hemoglobin: 9.6 g/dL — ABNORMAL LOW (ref 13.0–17.0)
Immature Granulocytes: 1 %
Lymphocytes Relative: 6 %
Lymphs Abs: 0.6 K/uL — ABNORMAL LOW (ref 0.7–4.0)
MCH: 33.7 pg (ref 26.0–34.0)
MCHC: 32.2 g/dL (ref 30.0–36.0)
MCV: 104.6 fL — ABNORMAL HIGH (ref 80.0–100.0)
Monocytes Absolute: 1.1 K/uL — ABNORMAL HIGH (ref 0.1–1.0)
Monocytes Relative: 11 %
Neutro Abs: 7.8 K/uL — ABNORMAL HIGH (ref 1.7–7.7)
Neutrophils Relative %: 80 %
Platelets: 109 K/uL — ABNORMAL LOW (ref 150–400)
RBC: 2.85 MIL/uL — ABNORMAL LOW (ref 4.22–5.81)
RDW: 14.5 % (ref 11.5–15.5)
WBC: 9.7 K/uL (ref 4.0–10.5)
nRBC: 0.2 % (ref 0.0–0.2)

## 2025-01-03 LAB — COMPREHENSIVE METABOLIC PANEL WITH GFR
ALT: 14 U/L (ref 0–44)
AST: 24 U/L (ref 15–41)
Albumin: 3.2 g/dL — ABNORMAL LOW (ref 3.5–5.0)
Alkaline Phosphatase: 90 U/L (ref 38–126)
Anion gap: 10 (ref 5–15)
BUN: 29 mg/dL — ABNORMAL HIGH (ref 8–23)
CO2: 23 mmol/L (ref 22–32)
Calcium: 8.3 mg/dL — ABNORMAL LOW (ref 8.9–10.3)
Chloride: 113 mmol/L — ABNORMAL HIGH (ref 98–111)
Creatinine, Ser: 1.28 mg/dL — ABNORMAL HIGH (ref 0.61–1.24)
GFR, Estimated: 58 mL/min — ABNORMAL LOW
Glucose, Bld: 162 mg/dL — ABNORMAL HIGH (ref 70–99)
Potassium: 3.9 mmol/L (ref 3.5–5.1)
Sodium: 145 mmol/L (ref 135–145)
Total Bilirubin: 0.4 mg/dL (ref 0.0–1.2)
Total Protein: 5.8 g/dL — ABNORMAL LOW (ref 6.5–8.1)

## 2025-01-03 MED ORDER — DOCUSATE SODIUM 100 MG PO CAPS
100.0000 mg | ORAL_CAPSULE | Freq: Two times a day (BID) | ORAL | Status: DC
  Administered 2025-01-03: 100 mg via ORAL
  Filled 2025-01-03 (×4): qty 1

## 2025-01-03 MED ORDER — OXYCODONE HCL 5 MG PO TABS: 5.0000 mg | ORAL_TABLET | ORAL | Status: DC | PRN

## 2025-01-03 MED ORDER — ACETAMINOPHEN 500 MG PO TABS
1000.0000 mg | ORAL_TABLET | Freq: Four times a day (QID) | ORAL | Status: DC
  Administered 2025-01-03 – 2025-01-08 (×17): 1000 mg via ORAL
  Filled 2025-01-03 (×20): qty 2

## 2025-01-03 MED ORDER — HYDROMORPHONE HCL 1 MG/ML IJ SOLN: 0.5000 mg | INTRAMUSCULAR | Status: DC | PRN

## 2025-01-03 MED ORDER — METOCLOPRAMIDE HCL 5 MG/ML IJ SOLN
10.0000 mg | Freq: Four times a day (QID) | INTRAMUSCULAR | Status: DC
  Administered 2025-01-03 – 2025-01-05 (×9): 10 mg via INTRAVENOUS
  Filled 2025-01-03 (×9): qty 2

## 2025-01-03 MED ORDER — ONDANSETRON HCL 4 MG/2ML IJ SOLN: 4.0000 mg | Freq: Four times a day (QID) | INTRAMUSCULAR | Status: DC | PRN

## 2025-01-03 NOTE — Progress Notes (Signed)
 " PROGRESS NOTE    Timothy Peterson  FMW:[id number] DOB: 1946-03-12 DOA: 12/27/2024 PCP: [facility]    Brief Narrative:  79 year old with history of hypertension, hyperlipidemia, A-flutter on Eliquis  who has been followed by gastroenterology for the last several months with history of diarrhea and enteritis.  Patient also reported 20 pound weight loss last 5 years.  Recent EGD and colonoscopy, capsule endoscopy on 1/9.  After about 24 hours of capsule endoscopy patient started having lower abdominal discomfort with nausea hiccups and burping.  In the emergency room hemodynamically stable.  Complaining of abdominal pain relieved with morphine .  Creatinine 1.6 with recent creatinine of 1.1.  CT scan abdomen pelvis with high-grade small bowel obstruction, capsule endoscopy still at the ileum.  Admitted with GI and surgery consultation. Patient did not improve with conservative management. 1/15, underwent open surgical ileocolectomy and anastomosis. Patient remains in the hospital due to postoperative ileus and poor bowel function.   Subjective:  Patient seen and examined.  He had 1 episode of vomiting after eating clears yesterday.  He looked anxious. I helped him to get out of the bed with RN at the bedside to the bedside commode, his NG tube just fell off. Patient is passing small volume stools and flatus.  He was anxious due to the last night's vomiting. Electrolytes are adequate.  Sodium and chloride normalized.   Assessment & Plan:   Partial small bowel obstruction, high-grade small bowel obstruction. 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  Pathology pending. Persistent ileus.  Did not improve with conservative management.  Underwent surgical resection and anastomosis.  Day 4 postop today.  GI and surgery following.  Biopsies pending.  Postop management as per surgery. Pain controlled.  Mobility.  Incentive spirometry. Prolonged ileus, surgery  following.  AKI from dehydration: Kidney function improved.  Sodium and chloride were elevated.  Given dextrose  infusion.  Will continue to monitor. Urinating adequate.  paroxysmal A-flutter: Patient is currently in sinus rhythm.  He is not on any rate control medications.  Was Therapeutic on Eliquis .  Will start Eliquis  when cleared by surgery.  Essential hypertension: Blood pressures stable.  Lisinopril  on hold.  As needed medications.  Hyperlipidemia: On statins.  Hold until oral intake.    DVT prophylaxis: enoxaparin  (LOVENOX ) injection 40 mg Start: 12/31/24 0800 SCDs Start: 12/27/24 2245 heparin  infusion   Code Status: Full code Family Communication: None at the bedside Disposition Plan: Status is: Inpatient Remains inpatient appropriate because: Significant symptoms, diet tolerance.  Immediate postop.     Consultants:  Gastroenterology General Surgery  Procedures:  Open ileo-colectomy and anstomosis   Antimicrobials:  Preoperative.     Objective: Vitals:   01/02/25 1332 01/02/25 2037 01/03/25 0500 01/03/25 0555  BP: (!) 163/61 (!) 151/64  (!) 141/59  Pulse: 82 80  74  Resp: 20 18  18   Temp: 97.7 F (36.5 C) 98.6 F (37 C)  98.3 F (36.8 C)  TempSrc:  Oral  Oral  SpO2: 99% 97%  96%  Weight:   79.2 kg   Height:        Intake/Output Summary (Last 24 hours) at 01/03/2025 1220 Last data filed at 01/02/2025 1700 Gross per 24 hour  Intake 429.62 ml  Output --  Net 429.62 ml   Filed Weights   01/01/25 0407 01/02/25 0700 01/03/25 0500  Weight: 80.6 kg 66.3 kg 79.2 kg    Examination:  General: Anxious.  Difficulty mobility.  In mild distress with abdominal bloating.  Cardiovascular: S1-S2 normal.  Regular rate rhythm. Respiratory: Bilateral good air entry, a lot of conducted airway sounds from his throat. Gastrointestinal: Soft.  Mildly tender along the surgical incisions.  Bowel sound present. Ext: No swelling or edema. Neuro: Alert awake and oriented.   Neurologically intact. Musculoskeletal: No deformities.       Data Reviewed: I have personally reviewed following labs and imaging studies  CBC: Recent Labs  Lab 12/30/24 0448 12/31/24 0453 01/01/25 0432 01/02/25 0430 01/03/25 0405  WBC 8.6 7.3 4.7 8.3 9.7  NEUTROABS  --   --   --  6.7 7.8*  HGB 11.0* 10.1* 10.5* 9.2* 9.6*  HCT 32.6* 31.1* 32.7* 28.5* 29.8*  MCV 101.2* 104.7* 105.8* 104.8* 104.6*  PLT 101* 114* 101* 105* 109*   Basic Metabolic Panel: Recent Labs  Lab 12/29/24 0449 12/31/24 0842 01/01/25 0805 01/02/25 0430 01/03/25 0405  NA 140 146* 147* 151* 145  K 4.8 5.0 4.5 4.3 3.9  CL 107 112* 114* 118* 113*  CO2 18* 21* 24 23 23   GLUCOSE 108* 188* 181* 177* 162*  BUN 31* 35* 28* 29* 29*  CREATININE 1.55* 1.64* 1.44* 1.38* 1.28*  CALCIUM 9.2 8.7* 8.7* 8.4* 8.3*  MG  --  2.6*  --  2.7*  --    GFR: Estimated Creatinine Clearance: 51.5 mL/min (A) (by C-G formula based on SCr of 1.28 mg/dL (H)). Liver Function Tests: Recent Labs  Lab 12/27/24 1430 12/28/24 0105 01/03/25 0405  AST 20 18 24   ALT 17 13 14   ALKPHOS 117 103 90  BILITOT 0.7 0.8 0.4  PROT 7.0 6.3* 5.8*  ALBUMIN  4.6 4.0 3.2*   Recent Labs  Lab 12/27/24 1430  LIPASE 22   No results for input(s): AMMONIA in the last 168 hours. Coagulation Profile: No results for input(s): INR, PROTIME in the last 168 hours. Cardiac Enzymes: No results for input(s): CKTOTAL, CKMB, CKMBINDEX, TROPONINI in the last 168 hours. BNP (last 3 results) No results for input(s): PROBNP in the last 8760 hours. HbA1C: No results for input(s): HGBA1C in the last 72 hours. CBG: Recent Labs  Lab 01/02/25 0103  GLUCAP 154*   Lipid Profile: No results for input(s): CHOL, HDL, LDLCALC, TRIG, CHOLHDL, LDLDIRECT in the last 72 hours. Thyroid Function Tests: No results for input(s): TSH, T4TOTAL, FREET4, T3FREE, THYROIDAB in the last 72 hours. Anemia Panel: No results for  input(s): VITAMINB12, FOLATE, FERRITIN, TIBC, IRON, RETICCTPCT in the last 72 hours. Sepsis Labs: No results for input(s): PROCALCITON, LATICACIDVEN in the last 168 hours.  No results found for this or any previous visit (from the past 240 hours).       Radiology Studies: CT HEAD WO CONTRAST ( ) Result Date: 01/02/2025 EXAM: CT HEAD WITHOUT CONTRAST 01/02/2025 01:38:15 AM TECHNIQUE: CT of the head was performed without the administration of intravenous contrast. Automated exposure control, iterative reconstruction, and/or weight based adjustment of the mA/kV was utilized to reduce the radiation dose to as low as reasonably achievable. COMPARISON: MRI 01/29/2007. 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 No acute hemorrhage. No evidence of acute infarct. No hydrocephalus. No extra-axial collection. No mass effect or midline shift. ORBITS: No acute abnormality. SINUSES: No acute abnormality. SOFT TISSUES AND SKULL: No acute soft tissue abnormality. No skull fracture. IMPRESSION: 1. No acute intracranial abnormality. 2. 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 Electronically signed by: Franky Crease MD 01/02/2025 01:41 AM EST RP Workstation: [id number]  Scheduled Meds:  bisacodyl   10 mg Rectal Daily   enoxaparin  (LOVENOX ) injection  40 mg Subcutaneous Q24H   methocarbamol  (ROBAXIN ) injection  1,000 mg Intravenous Q8H    Continuous Infusions:     LOS: 7 days    Time spent: 35 minutes    Renato Applebaum, MD [facility]   "

## 2025-01-03 NOTE — Progress Notes (Signed)
 "  Progress Note  4 Days Post-Op  Subjective: Patient reports that after having CLD yesterday, he had an episode of vomiting. He continues to be nauseous this morning and feels that he might vomiting. He reports a BM yesterday and flatulence. Denies pain or abdominal distention.   ROS  All negative with the exception of above.  Objective: Vital signs in last 24 hours: Temp:  [97.7 F (36.5 C)-98.6 F (37 C)] 98.3 F (36.8 C) (01/19 0555) Pulse Rate:  [74-82] 74 (01/19 0555) Resp:  [18-20] 18 (01/19 0555) BP: (141-163)/(59-64) 141/59 (01/19 0555) SpO2:  [96 %-99 %] 96 % (01/19 0555) Weight:  [79.2 kg] 79.2 kg (01/19 0500) Last BM Date : 01/02/25  Intake/Output from previous day: 01/18 0701 - 01/19 0700 In: 669.6 [P.O.:480; I.V.:189.6] Out: 600 [Urine:300; Emesis/NG output:300] Intake/Output this shift: No intake/output data recorded.  PE: General: Pleasant male who is laying in bed in NAD. HEENT: Head is normocephalic, atraumatic. NGT in place. Heart: HR normal during encounter.  Lungs: Respiratory effort nonlabored on room air.  Abd: Soft with mild distention. NT. Incisions C/D/I. No rebound tenderness or guarding.  Skin: Warm and dry.  Psych: A&Ox3 with an appropriate affect.    Lab Results:  Recent Labs    01/02/25 0430 01/03/25 0405  WBC 8.3 9.7  HGB 9.2* 9.6*  HCT 28.5* 29.8*  PLT 105* 109*   BMET Recent Labs    01/02/25 0430 01/03/25 0405  NA 151* 145  K 4.3 3.9  CL 118* 113*  CO2 23 23  GLUCOSE 177* 162*  BUN 29* 29*  CREATININE 1.38* 1.28*  CALCIUM 8.4* 8.3*   PT/INR No results for input(s): LABPROT, INR in the last 72 hours. CMP     Component Value Date/Time   NA 145 01/03/2025 0405   K 3.9 01/03/2025 0405   CL 113 (H) 01/03/2025 0405   CO2 23 01/03/2025 0405   GLUCOSE 162 (H) 01/03/2025 0405   BUN 29 (H) 01/03/2025 0405   CREATININE 1.28 (H) 01/03/2025 0405   CALCIUM 8.3 (L) 01/03/2025 0405   PROT 5.8 (L) 01/03/2025 0405    ALBUMIN  3.2 (L) 01/03/2025 0405   AST 24 01/03/2025 0405   ALT 14 01/03/2025 0405   ALKPHOS 90 01/03/2025 0405   BILITOT 0.4 01/03/2025 0405   GFRNONAA 58 (L) 01/03/2025 0405   GFRAA >60 06/29/2016 1838   Lipase     Component Value Date/Time   LIPASE 22 12/27/2024 1430       Studies/Results: CT HEAD WO CONTRAST ( ) Result Date: 01/02/2025 EXAM: CT HEAD WITHOUT CONTRAST 01/02/2025 01:38:15 AM TECHNIQUE: CT of the head was performed without the administration of intravenous contrast. Automated exposure control, iterative reconstruction, and/or weight based adjustment of the mA/kV was utilized to reduce the radiation dose to as low as reasonably achievable. COMPARISON: MRI 01/29/2007. 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 No acute hemorrhage. No evidence of acute infarct. No hydrocephalus. No extra-axial collection. No mass effect or midline shift. ORBITS: No acute abnormality. SINUSES: No acute abnormality. SOFT TISSUES AND SKULL: No acute soft tissue abnormality. No skull fracture. IMPRESSION: 1. No acute intracranial abnormality. 2. 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 Electronically signed by: Franky Crease MD 01/02/2025 01:41 AM EST RP Workstation: [id number]    Anti-infectives: Anti-infectives (From admission, onward)    Start     Dose/Rate Route Frequency Ordered Stop   12/30/24 2000  cefoTEtan  (CEFOTAN ) 2 g in sodium chloride  0.9 % 100 mL IVPB        2 g 200 mL/hr over 30 Minutes Intravenous Every 12 hours 12/30/24 1149 12/30/24 2159   12/30/24 0600  cefoTEtan  (CEFOTAN ) 2 g in sodium chloride  0.9 % 100 mL IVPB        2 g 200 mL/hr over 30 Minutes Intravenous On call to O.R. 12/29/24 1338 12/30/24 0805        Assessment/Plan POD 4, s/p lap assisted ileocecectomy,  primary umbilical hernia repair by Dr. Sheldon, 1/15 for SBO secondary to  presumed Crohn's disease -Afebrile. -WBC 9.7; HGB 9.6 from 9.2 -NGT output 300 mL from 1/18-1/19. -After having CLD, patient had episode of emesis yesterday 1/18. Continues to be nauseated this AM. Reports BM yesterday and flatulence. Denies pain.  -Will place NGT back to suction and make NPO. Will discuss further with attending.  -Pathology pending -Continue to mobilize as tolerated. -Continue multi-modal pain control -Will continue to follow.   FEN - NPO/NGT/IVFs per primary team VTE - Lovenox  ID - None currently needed   LOS: 7 days   I reviewed nursing notes, specialist notes, hospitalist notes, last 24 h vitals and pain scores, last 48 h intake and output, last 24 h labs and trends, and last 24 h imaging results.  Marjorie Carlyon Favre, [facility] Surgery 01/03/2025, 10:09 AM Please see Amion for pager number during day hours 7:00am-4:30pm  "

## 2025-01-03 NOTE — Progress Notes (Signed)
 Mobility Specialist Progress Note:   01/03/25 1034  Mobility  Activity Pivoted/transferred to/from [facility]  Level of Assistance Minimal assist, patient does 75% or more  Assistive Device Front wheel walker  Distance Ambulated (ft) 2 ft  Activity Response Tolerated fair  Mobility Referral Yes  Mobility visit 1 Mobility  Mobility Specialist Start Time (ACUTE ONLY) 1015  Mobility Specialist Stop Time (ACUTE ONLY) 1026  Mobility Specialist Time Calculation (min) (ACUTE ONLY) 11 min   Assisted w/ NT to put Pt back to bed. Pt stated feeling too weak to ambulate further. Returned to bed with all needs met. Left in room with NT.  [employer] - Mobility Specialist

## 2025-01-04 ENCOUNTER — Telehealth: Payer: Self-pay | Admitting: Gastroenterology

## 2025-01-04 DIAGNOSIS — K56609 Unspecified intestinal obstruction, unspecified as to partial versus complete obstruction: Secondary | ICD-10-CM | POA: Diagnosis not present

## 2025-01-04 DIAGNOSIS — K50012 Crohn's disease of small intestine with intestinal obstruction: Secondary | ICD-10-CM | POA: Diagnosis not present

## 2025-01-04 LAB — CBC
HCT: 29.6 % — ABNORMAL LOW (ref 39.0–52.0)
Hemoglobin: 9.6 g/dL — ABNORMAL LOW (ref 13.0–17.0)
MCH: 33.4 pg (ref 26.0–34.0)
MCHC: 32.4 g/dL (ref 30.0–36.0)
MCV: 103.1 fL — ABNORMAL HIGH (ref 80.0–100.0)
Platelets: 112 K/uL — ABNORMAL LOW (ref 150–400)
RBC: 2.87 MIL/uL — ABNORMAL LOW (ref 4.22–5.81)
RDW: 14.1 % (ref 11.5–15.5)
WBC: 9.7 K/uL (ref 4.0–10.5)
nRBC: 0.2 % (ref 0.0–0.2)

## 2025-01-04 LAB — BASIC METABOLIC PANEL WITH GFR
Anion gap: 10 (ref 5–15)
BUN: 28 mg/dL — ABNORMAL HIGH (ref 8–23)
CO2: 22 mmol/L (ref 22–32)
Calcium: 8.7 mg/dL — ABNORMAL LOW (ref 8.9–10.3)
Chloride: 113 mmol/L — ABNORMAL HIGH (ref 98–111)
Creatinine, Ser: 1.24 mg/dL (ref 0.61–1.24)
GFR, Estimated: 60 mL/min — ABNORMAL LOW
Glucose, Bld: 147 mg/dL — ABNORMAL HIGH (ref 70–99)
Potassium: 4 mmol/L (ref 3.5–5.1)
Sodium: 146 mmol/L — ABNORMAL HIGH (ref 135–145)

## 2025-01-04 LAB — SURGICAL PATHOLOGY

## 2025-01-04 LAB — MAGNESIUM: Magnesium: 3 mg/dL — ABNORMAL HIGH (ref 1.7–2.4)

## 2025-01-04 MED ORDER — SODIUM CHLORIDE 0.45 % IV SOLN: INTRAVENOUS | Status: AC

## 2025-01-04 NOTE — Progress Notes (Signed)
 " PROGRESS NOTE    Timothy Peterson  FMW:[id number] DOB: 1946/02/19 DOA: 12/27/2024 PCP: [facility]    Brief Narrative:  79 year old with history of hypertension, hyperlipidemia, A-flutter on Eliquis  who has been followed by gastroenterology for the last several months with history of diarrhea and enteritis.  Patient also reported 20 pound weight loss last 5 years.  Recent EGD and colonoscopy, capsule endoscopy on 1/9.  After about 24 hours of capsule endoscopy patient started having lower abdominal discomfort with nausea hiccups and burping.  In the emergency room hemodynamically stable.  Complaining of abdominal pain relieved with morphine .  Creatinine 1.6 with recent creatinine of 1.1.  CT scan abdomen pelvis with high-grade small bowel obstruction, capsule endoscopy still at the ileum.  Admitted with GI and surgery consultation. Patient did not improve with conservative management. 1/15, underwent open surgical ileocolectomy and anastomosis. Patient remains in the hospital due to postoperative ileus and poor bowel function.   Subjective:  Patient seen and examined.  He was resting to go to the bedside commode, I helped him.  Patient tells me that he had 2 episodes of vomiting after ice water  yesterday.  Belly is Grumbling.  Pain is about 3 out of 10.  He is having small volume watery stool. Biopsy consistent with Crohn's disease.   Assessment & Plan:   Partial small bowel obstruction, high-grade small bowel obstruction. 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  Pathology pending. Persistent ileus.  Did not improve with conservative management.  Underwent surgical resection and anastomosis.  Day 5 postop today.  GI and surgery following.   Intolerance to diet.  Start low-dose IV fluids. Pain controlled.  Mobility.  Incentive spirometry. Prolonged ileus, surgery following. Biopsy consistent with Crohn's disease.  Patient will need to start  treatment as outpatient once he surgically recovers.  AKI from dehydration: Kidney function improved.  Sodium and chloride were elevated.  Keep on half-normal saline today.  Will continue to monitor. Urinating adequate.  paroxysmal A-flutter: Patient is currently in sinus rhythm.  He is not on any rate control medications.  Was Therapeutic on Eliquis .  Will start Eliquis  when cleared by surgery.  Essential hypertension: Blood pressures stable.  Lisinopril  on hold.  As needed medications.  Hyperlipidemia: On statins.  Hold until oral intake.    DVT prophylaxis: enoxaparin  (LOVENOX ) injection 40 mg Start: 12/31/24 0800 SCDs Start: 12/27/24 2245 heparin  infusion   Code Status: Full code Family Communication: None at the bedside Disposition Plan: Status is: Inpatient Remains inpatient appropriate because: Significant symptoms, diet tolerance.  Immediate postop.     Consultants:  Gastroenterology General Surgery  Procedures:  Open ileo-colectomy and anstomosis   Antimicrobials:  Preoperative.     Objective: Vitals:   01/03/25 1558 01/03/25 1948 01/04/25 0500 01/04/25 0552  BP: (!) 142/65 (!) 140/55  (!) 148/57  Pulse: 74 66  76  Resp: 20 18    Temp: 98.1 F (36.7 C) 98.3 F (36.8 C)  98.4 F (36.9 C)  TempSrc: Oral   Oral  SpO2: 97% 96%  96%  Weight:   78 kg   Height:        Intake/Output Summary (Last 24 hours) at 01/04/2025 1103 Last data filed at 01/04/2025 0800 Gross per 24 hour  Intake --  Output 300 ml  Net -300 ml   Filed Weights   01/02/25 0700 01/03/25 0500 01/04/25 0500  Weight: 66.3 kg 79.2 kg 78 kg    Examination:  General: Slightly anxious  due to ongoing issues.  Looked comfortable while sitting at the edge of the bed. Cardiovascular: S1-S2 normal.  Regular rate rhythm. Respiratory: Bilateral good air entry, no added sounds.  On room air. Gastrointestinal: Soft.  Mildly tender along the surgical incisions.  Bowel sound present. Ext: No  swelling or edema. Neuro: Alert awake and oriented.  Neurologically intact. Musculoskeletal: No deformities.       Data Reviewed: I have personally reviewed following labs and imaging studies  CBC: Recent Labs  Lab 12/31/24 0453 01/01/25 0432 01/02/25 0430 01/03/25 0405 01/04/25 0413  WBC 7.3 4.7 8.3 9.7 9.7  NEUTROABS  --   --  6.7 7.8*  --   HGB 10.1* 10.5* 9.2* 9.6* 9.6*  HCT 31.1* 32.7* 28.5* 29.8* 29.6*  MCV 104.7* 105.8* 104.8* 104.6* 103.1*  PLT 114* 101* 105* 109* 112*   Basic Metabolic Panel: Recent Labs  Lab 12/31/24 0842 01/01/25 0805 01/02/25 0430 01/03/25 0405 01/04/25 0413  NA 146* 147* 151* 145 146*  K 5.0 4.5 4.3 3.9 4.0  CL 112* 114* 118* 113* 113*  CO2 21* 24 23 23 22   GLUCOSE 188* 181* 177* 162* 147*  BUN 35* 28* 29* 29* 28*  CREATININE 1.64* 1.44* 1.38* 1.28* 1.24  CALCIUM 8.7* 8.7* 8.4* 8.3* 8.7*  MG 2.6*  --  2.7*  --  3.0*   GFR: Estimated Creatinine Clearance: 53.1 mL/min (by C-G formula based on SCr of 1.24 mg/dL). Liver Function Tests: Recent Labs  Lab 01/03/25 0405  AST 24  ALT 14  ALKPHOS 90  BILITOT 0.4  PROT 5.8*  ALBUMIN  3.2*   No results for input(s): LIPASE, AMYLASE in the last 168 hours.  No results for input(s): AMMONIA in the last 168 hours. Coagulation Profile: No results for input(s): INR, PROTIME in the last 168 hours. Cardiac Enzymes: No results for input(s): CKTOTAL, CKMB, CKMBINDEX, TROPONINI in the last 168 hours. BNP (last 3 results) No results for input(s): PROBNP in the last 8760 hours. HbA1C: No results for input(s): HGBA1C in the last 72 hours. CBG: Recent Labs  Lab 01/02/25 0103  GLUCAP 154*   Lipid Profile: No results for input(s): CHOL, HDL, LDLCALC, TRIG, CHOLHDL, LDLDIRECT in the last 72 hours. Thyroid Function Tests: No results for input(s): TSH, T4TOTAL, FREET4, T3FREE, THYROIDAB in the last 72 hours. Anemia Panel: No results for input(s):  VITAMINB12, FOLATE, FERRITIN, TIBC, IRON, RETICCTPCT in the last 72 hours. Sepsis Labs: No results for input(s): PROCALCITON, LATICACIDVEN in the last 168 hours.  No results found for this or any previous visit (from the past 240 hours).       Radiology Studies: No results found.       Scheduled Meds:  acetaminophen   1,000 mg Oral Q6H   docusate sodium   100 mg Oral BID   enoxaparin  (LOVENOX ) injection  40 mg Subcutaneous Q24H   methocarbamol  (ROBAXIN ) injection  1,000 mg Intravenous Q8H   metoCLOPramide  (REGLAN ) injection  10 mg Intravenous Q6H    Continuous Infusions:  sodium chloride         LOS: 8 days    Time spent: 35 minutes    Renato Applebaum, MD [facility]   "

## 2025-01-04 NOTE — Progress Notes (Signed)
 "  Progress Note  5 Days Post-Op  Subjective: Reports overall feeling slightly better today.  Has continued to have loose bowel movements.  No further nausea or vomiting, but he has noticed some more soreness in the right lower quadrant today.  ROS  All negative with the exception of above.  Objective: Vital signs in last 24 hours: Temp:  [98.1 F (36.7 C)-98.4 F (36.9 C)] 98.4 F (36.9 C) (01/20 0552) Pulse Rate:  [66-76] 76 (01/20 0552) Resp:  [18-20] 18 (01/19 1948) BP: (140-148)/(55-65) 148/57 (01/20 0552) SpO2:  [96 %-97 %] 96 % (01/20 0552) Weight:  [78 kg] 78 kg (01/20 0500) Last BM Date : 01/04/25  Intake/Output from previous day: No intake/output data recorded. Intake/Output this shift: Total I/O In: -  Out: 300 [Urine:300]  PE: General: Pleasant male who is laying in bed in NAD. HEENT: Head is normocephalic, atraumatic. NGT in place. Heart: HR normal during encounter.  Lungs: Respiratory effort nonlabored on room air.  Abd: Soft with mild distention, slightly improved compared to yesterday.  Mildly tender in right lower quadrant. Incisions C/D/I with the evolving ecchymosis. No rebound tenderness or guarding.  Skin: Warm and dry.  Psych: A&Ox3 with an appropriate affect.    Lab Results:  Recent Labs    01/03/25 0405 01/04/25 0413  WBC 9.7 9.7  HGB 9.6* 9.6*  HCT 29.8* 29.6*  PLT 109* 112*   BMET Recent Labs    01/03/25 0405 01/04/25 0413  NA 145 146*  K 3.9 4.0  CL 113* 113*  CO2 23 22  GLUCOSE 162* 147*  BUN 29* 28*  CREATININE 1.28* 1.24  CALCIUM 8.3* 8.7*   PT/INR No results for input(s): LABPROT, INR in the last 72 hours. CMP     Component Value Date/Time   NA 146 (H) 01/04/2025 0413   K 4.0 01/04/2025 0413   CL 113 (H) 01/04/2025 0413   CO2 22 01/04/2025 0413   GLUCOSE 147 (H) 01/04/2025 0413   BUN 28 (H) 01/04/2025 0413   CREATININE 1.24 01/04/2025 0413   CALCIUM 8.7 (L) 01/04/2025 0413   PROT 5.8 (L) 01/03/2025 0405    ALBUMIN  3.2 (L) 01/03/2025 0405   AST 24 01/03/2025 0405   ALT 14 01/03/2025 0405   ALKPHOS 90 01/03/2025 0405   BILITOT 0.4 01/03/2025 0405   GFRNONAA 60 (L) 01/04/2025 0413   GFRAA >60 06/29/2016 1838   Lipase     Component Value Date/Time   LIPASE 22 12/27/2024 1430       Studies/Results: No results found.   Anti-infectives: Anti-infectives (From admission, onward)    Start     Dose/Rate Route Frequency Ordered Stop   12/30/24 2000  cefoTEtan  (CEFOTAN ) 2 g in sodium chloride  0.9 % 100 mL IVPB        2 g 200 mL/hr over 30 Minutes Intravenous Every 12 hours 12/30/24 1149 12/30/24 2159   12/30/24 0600  cefoTEtan  (CEFOTAN ) 2 g in sodium chloride  0.9 % 100 mL IVPB        2 g 200 mL/hr over 30 Minutes Intravenous On call to O.R. 12/29/24 1338 12/30/24 0805        Assessment/Plan POD 5, s/p lap assisted ileocecectomy,  primary umbilical hernia repair by Dr. Sheldon, 1/15 for SBO secondary to presumed Crohn's disease -Afebrile. -WBC/hgb stable -Try sips of clears again today -Pathology pending -Continue to mobilize as tolerated. -Continue multi-modal pain control -Will continue to follow.   FEN -clears, IVFs per primary team VTE -  Lovenox  ID - None currently needed   LOS: 8 days   I reviewed nursing notes, specialist notes, hospitalist notes, last 24 h vitals and pain scores, last 48 h intake and output, last 24 h labs and trends, and last 24 h imaging results.  Mitzie DELENA Freund, MD  [facility] Surgery 01/04/2025, 12:46 PM Please see Amion for pager number during day hours 7:00am-4:30pm  "

## 2025-01-04 NOTE — Telephone Encounter (Signed)
-----   Message from Aloha Finner, MD sent at 01/03/2025  4:17 PM EST ----- Regarding: RE: Please schedule accordingly. Thanks. GM ----- Message ----- From: Albertus Gordy HERO, MD Sent: 12/31/2024   3:13 PM EST To: Bari JONETTA Lesches; Aloha Finner Raddle., MD  Patient needs outpatient follow-up with Dr. Finner in 6 to 8 weeks  Small bowel obstruction after video capsule endoscopy due to Crohn's disease Status post ileocecectomy 12/30/2024  GM: FYI  Thanks JMP

## 2025-01-04 NOTE — Telephone Encounter (Signed)
 Patient is scheduled with Dr. Wilhelmenia on 02/18/25 @ 2:50.  Patient notified.

## 2025-01-04 NOTE — Plan of Care (Signed)
   Problem: Education: Goal: Knowledge of General Education information will improve Description Including pain rating scale, medication(s)/side effects and non-pharmacologic comfort measures Outcome: Progressing   Problem: Clinical Measurements: Goal: Ability to maintain clinical measurements within normal limits will improve Outcome: Progressing   Problem: Clinical Measurements: Goal: Will remain free from infection Outcome: Progressing

## 2025-01-04 NOTE — Progress Notes (Signed)
 PT Cancellation Note  Patient Details Name: Timothy Peterson MRN: [id number] DOB: [birth year]   Cancelled Treatment:     Per Nursing, hold off for today.  Rehab Team to attempt another day per patient tolerance and as schedule permits.    Katheryn Leap  PTA Acute  Rehabilitation Services Office M-F          [phone]

## 2025-01-04 NOTE — Progress Notes (Signed)
 Status post ileocecal resection for obstruction last week.  Capsule removed en bloc Pathology consistent with Crohn's disease.  Slowly recovering in hospital. [facility] aware.  I believe outpatient follow-up being arranged with Gastroenterology: Dr. Wilhelmenia.  SURGICAL PATHOLOGY CASE: [id number] PATIENT: Timothy Peterson Surgical Pathology Report     Clinical History: Ileo stricture, retained capsule, question Crohn's (las)     FINAL MICROSCOPIC DIAGNOSIS:  A. ILEOCECAL REGION, PARTIAL COLECTOMY: - Active chronic ileitis with transmural inflammation, fissuring ulcers, pyloric gland metaplasia and strictures, see comment - Margins appear viable and uninvolved - Benign unremarkable appendix - Benign reactive lymph nodes- - Negative for dysplasia or malignancy      COMMENT:  The findings are most consistent with Crohn's disease.    [name] DESCRIPTION:  A. Received fresh and subsequently placed in formalin labeled with the patient's name and Ileocecal region is an ileocecectomy consisting of 49.1 cm of ileum and 9.3 cm of cecum with a 4.8 x 0.8 cm appendix. Serosa: red-tan, smooth, and glistening. Mucosa: two separate red-brown, cobbled, circumferential lesions with associated wall induration are present in the ileum.      More proximal - 5.9 x 4.3 x 0.4 cm with notable constriction of the lumen      More distal - 5.7 x 5.7 x 0.2 cm      On cut surface, the lesions involve the mucosa without invasion into the wall. The remaining, uninvolved mucosa is tan and normally folded without additional masses or lesions. Margins: 11.6 cm from proximal (green), 34.6 cm from distal (blue), and 4.8 cm from the fibrofatty resection margin (orange). Lymph nodes: 25 pink-tan, rubbery possible lymph nodes ranging from 0.1-1.4 cm in greatest dimension are identified. Block Summary A1: resection margins A2: proximal lesion with serosa A3-A4: additional  representative proximal lesion A5: proximal lesion with adjacent normal A6: intervening segment A7-A8: representative distal A9: distal lesion with adjacent normal A10: appendix A11: one sectioned node A12: one sectioned node A13: one sectioned node A14: one sectioned node A15: six whole nodes A16: eight whole nodes A17: six whole nodes A18: one sectioned node A19: one whole node (LEF 12/31/2024)   Final Diagnosis performed by Katrine Muskrat, MD.   Electronically signed 01/03/2025 Technical and / or Professional components performed at [facility], [address], [place], [place] [address].  Immunohistochemistry Technical component (if applicable) was performed at [facility]. [address], [place], [place] [address].   IMMUNOHISTOCHEMISTRY DISCLAIMER (if applicable): Some of these immunohistochemical stains may have been developed and the performance characteristics determine by [facility]. Some may not have been cleared or approved by the U.S. Food and Drug Administration. The FDA has determined that such clearance or approval is not necessary. This test is used for clinical purposes. It should not be regarded as investigational or for research. This laboratory is certified under the Clinical Laboratory Improvement Amendments of 1988 (CLIA-88) as qualified to perform high complexity clinical laboratory testing.  The controls stained appropriately.   IHC stains are performed on formalin fixed, paraffin embedded tissue using a 3,3diaminobenzidine (DAB) chromogen and Leica Bond Autostainer System. The staining intensity of the nucleus is score manually and is reported as the percentage of tumor cell nuclei demonstrating specific nuclear staining. The specimens are fixed in 10% Neutral Formalin for at least 6 hours and up to 72hrs. These tests are validated on decalcified tissue. Results should be interpreted with  caution given the possibility of false negative results on  decalcified specimens. Antibody Clones are as follows ER-clone 11F, PR-clone 16, Ki67- clone MM1. Some of these immunohistochemical stains may have been developed and the performance characteristics determined by [facility].   Elspeth KYM Schultze, MD, FACS, MASCRS Esophageal, Gastrointestinal & Colorectal Surgery Robotic and Minimally Invasive Surgery  [facility] [address] [place], [place] [address] [phone] Fax [phone] Main  CONTACT INFORMATION: Weekday (9AM-5PM): Call [facility] main office at [phone] Weeknight (5PM-9AM) or Weekend/Holiday: Check EPIC Web Links tab & use AMION (password  [email or web address]) for General Surgery [facility] coverage  Please, DO NOT use SecureChat  (it is not reliable communication to reach operating surgeons & will lead to a delay in care).   Epic staff messaging available for outpatient concerns needing 1-2 business day response.

## 2025-01-05 DIAGNOSIS — K50012 Crohn's disease of small intestine with intestinal obstruction: Secondary | ICD-10-CM | POA: Diagnosis not present

## 2025-01-05 LAB — CBC
HCT: 31.7 % — ABNORMAL LOW (ref 39.0–52.0)
Hemoglobin: 10 g/dL — ABNORMAL LOW (ref 13.0–17.0)
MCH: 33 pg (ref 26.0–34.0)
MCHC: 31.5 g/dL (ref 30.0–36.0)
MCV: 104.6 fL — ABNORMAL HIGH (ref 80.0–100.0)
Platelets: 125 K/uL — ABNORMAL LOW (ref 150–400)
RBC: 3.03 MIL/uL — ABNORMAL LOW (ref 4.22–5.81)
RDW: 14.3 % (ref 11.5–15.5)
WBC: 10.6 K/uL — ABNORMAL HIGH (ref 4.0–10.5)
nRBC: 0 % (ref 0.0–0.2)

## 2025-01-05 LAB — BASIC METABOLIC PANEL WITH GFR
Anion gap: 11 (ref 5–15)
BUN: 27 mg/dL — ABNORMAL HIGH (ref 8–23)
CO2: 20 mmol/L — ABNORMAL LOW (ref 22–32)
Calcium: 8.6 mg/dL — ABNORMAL LOW (ref 8.9–10.3)
Chloride: 113 mmol/L — ABNORMAL HIGH (ref 98–111)
Creatinine, Ser: 1.21 mg/dL (ref 0.61–1.24)
GFR, Estimated: >60 mL/min
Glucose, Bld: 162 mg/dL — ABNORMAL HIGH (ref 70–99)
Potassium: 4 mmol/L (ref 3.5–5.1)
Sodium: 143 mmol/L (ref 135–145)

## 2025-01-05 LAB — MAGNESIUM: Magnesium: 2.7 mg/dL — ABNORMAL HIGH (ref 1.7–2.4)

## 2025-01-05 MED ORDER — APIXABAN 5 MG PO TABS
5.0000 mg | ORAL_TABLET | Freq: Two times a day (BID) | ORAL | Status: DC
  Administered 2025-01-05 – 2025-01-08 (×6): 5 mg via ORAL
  Filled 2025-01-05 (×6): qty 1

## 2025-01-05 MED ORDER — ENSURE PLUS HIGH PROTEIN PO LIQD
237.0000 mL | Freq: Two times a day (BID) | ORAL | Status: DC
  Administered 2025-01-05 – 2025-01-07 (×4): 237 mL via ORAL

## 2025-01-05 NOTE — Progress Notes (Addendum)
 "  Progress Note  6 Days Post-Op  Subjective: Patient reports pain is manageable. Tolerated CLD. Reports minimal episodes of nausea. Denies vomiting. Has had multiple bowel movements and flatulence.   ROS  All negative with the exception of above.  Objective: Vital signs in last 24 hours: Temp:  [97.4 F (36.3 C)-98.3 F (36.8 C)] 98.3 F (36.8 C) (01/21 0505) Pulse Rate:  [71-83] 71 (01/21 0505) Resp:  [16-17] 17 (01/21 0505) BP: (146-170)/(58-64) 158/60 (01/21 0505) SpO2:  [97 %-100 %] 97 % (01/21 0505) Weight:  [78.5 kg] 78.5 kg (01/21 0500) Last BM Date : 01/04/25  Intake/Output from previous day: 01/20 0701 - 01/21 0700 In: -  Out: 300 [Urine:300] Intake/Output this shift: No intake/output data recorded.  PE: General: Pleasant male who is laying in bed in NAD. HEENT: Head is normocephalic, atraumatic.  Heart: HR normal during encounter.  Lungs: Respiratory effort nonlabored on room air.  Abd: Soft with mild distention. NT. Incisions C/D/I. No rebound tenderness or guarding.  Skin: Warm and dry.  Psych: A&Ox3 with an appropriate affect.    Lab Results:  Recent Labs    01/04/25 0413 01/05/25 0427  WBC 9.7 10.6*  HGB 9.6* 10.0*  HCT 29.6* 31.7*  PLT 112* 125*   BMET Recent Labs    01/04/25 0413 01/05/25 0427  NA 146* 143  K 4.0 4.0  CL 113* 113*  CO2 22 20*  GLUCOSE 147* 162*  BUN 28* 27*  CREATININE 1.24 1.21  CALCIUM 8.7* 8.6*   PT/INR No results for input(s): LABPROT, INR in the last 72 hours. CMP     Component Value Date/Time   NA 143 01/05/2025 0427   K 4.0 01/05/2025 0427   CL 113 (H) 01/05/2025 0427   CO2 20 (L) 01/05/2025 0427   GLUCOSE 162 (H) 01/05/2025 0427   BUN 27 (H) 01/05/2025 0427   CREATININE 1.21 01/05/2025 0427   CALCIUM 8.6 (L) 01/05/2025 0427   PROT 5.8 (L) 01/03/2025 0405   ALBUMIN  3.2 (L) 01/03/2025 0405   AST 24 01/03/2025 0405   ALT 14 01/03/2025 0405   ALKPHOS 90 01/03/2025 0405   BILITOT 0.4 01/03/2025  0405   GFRNONAA >60 01/05/2025 0427   GFRAA >60 06/29/2016 1838   Lipase     Component Value Date/Time   LIPASE 22 12/27/2024 1430       Studies/Results: No results found.  Anti-infectives: Anti-infectives (From admission, onward)    Start     Dose/Rate Route Frequency Ordered Stop   12/30/24 2000  cefoTEtan  (CEFOTAN ) 2 g in sodium chloride  0.9 % 100 mL IVPB        2 g 200 mL/hr over 30 Minutes Intravenous Every 12 hours 12/30/24 1149 12/30/24 2159   12/30/24 0600  cefoTEtan  (CEFOTAN ) 2 g in sodium chloride  0.9 % 100 mL IVPB        2 g 200 mL/hr over 30 Minutes Intravenous On call to O.R. 12/29/24 1338 12/30/24 0805        Assessment/Plan POD 6, s/p lap assisted ileocecectomy,  primary umbilical hernia repair by Dr. Sheldon, 1/15 for SBO  -Afebrile. -WBC 10.6 from 9.7; HGB 10.0 from 9.6 -NGT fell out 1/19. -Tolerated CLD. Will advance to FLD. Reports BM yesterday and flatulence. Pain manageable. No vomiting.  -Pathology with active chronic ileitis with transmural inflammation, fissuring ulcers, pyloric gland metaplasia and strictures. Margins apear viable and uninvolved. Benign unremarkable appendix. Benign reactive lymph nodes. Negative for dysplasia or malignancy. Consistent with Crohn's  possible Crohn's. [facility] aware.  -Continue to mobilize as tolerated. -Continue multi-modal pain control -Will continue to follow.   FEN - FLD/IVFs per primary team VTE - Lovenox  ID - None currently needed   LOS: 9 days   I reviewed specialist notes, consulting provider notes, hospitalist notes, nursing notes, last 24 h vitals and pain scores, last 48 h intake and output, last 24 h labs and trends, and last 24 h imaging results.  Marjorie Carlyon Favre, [facility] 01/05/2025, 8:24 AM Please see Amion for pager number during day hours 7:00am-4:30pm  "

## 2025-01-05 NOTE — Plan of Care (Signed)
   Problem: Education: Goal: Knowledge of General Education information will improve Description: Including pain rating scale, medication(s)/side effects and non-pharmacologic comfort measures Outcome: Progressing   Problem: Clinical Measurements: Goal: Ability to maintain clinical measurements within normal limits will improve Outcome: Progressing   Problem: Clinical Measurements: Goal: Diagnostic test results will improve Outcome: Progressing   Problem: Clinical Measurements: Goal: Respiratory complications will improve Outcome: Progressing

## 2025-01-05 NOTE — Progress Notes (Signed)
 " PROGRESS NOTE    Timothy Peterson  FMW:[id number] DOB: 05-05-1946 DOA: 12/27/2024 PCP: [facility]  Brief Narrative:   79 year old with history of hypertension, hyperlipidemia, A-flutter on Eliquis  who has been followed by gastroenterology for the last several months with history of diarrhea and enteritis.  Patient also reported 20 pound weight loss last 5 years.  Recent EGD and colonoscopy, capsule endoscopy on 1/9.  After about 24 hours of capsule endoscopy patient started having lower abdominal discomfort with nausea hiccups and burping.  In the emergency room hemodynamically stable.  Complaining of abdominal pain relieved with morphine .  Creatinine 1.6 with recent creatinine of 1.1.   CT scan abdomen pelvis with high-grade small bowel obstruction, capsule endoscopy still at the ileum.  Admitted with GI and surgery consultation. Patient did not improve with conservative management. 1/15, underwent open surgical ileocolectomy and anastomosis. Patient remains in the hospital due to postoperative ileus and poor bowel function. 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 Chronic abdominal pain with enteritis: Pathology findings consistent with Crohn's disease.  Active chronic ileitis with transmural inflammation fissuring ulcers pyloric gland metaplasia and strictures.  Negative for malignancy.  He underwent surgical resection open ileocolectomy and anastomosis GI and general surgery following.SABRA He reports having multiple bowel movements overnight.  DC stool softeners.  AKI from dehydration: Improved on slow IV fluids.      paroxysmal A-flutter: Patient is currently in sinus rhythm.  He is not on any rate control medications.  Was Therapeutic on Eliquis .  Will start Eliquis  when cleared by surgery.   Essential hypertension: Blood pressures stable.  Lisinopril  on hold.  As needed medications.   Hyperlipidemia: On statins.  Hold until oral intake.   Estimated body mass index is 24.14 kg/m as calculated from the following:   Height as of this encounter: 5' 11 (1.803 m).   Weight as of this encounter: 78.5 kg.  DVT prophylaxis: Lovenox  Code Status: Full code Family Communication: None Disposition Plan:  Status is: Inpatient Remains inpatient appropriate because: Acute illness   Consultants: General Surgery  Procedures: Open ileocolectomy and anastomosis Antimicrobials: None  Subjective: Reports having multiple BMs overnight does have some nausea but no vomiting Objective: Vitals:   01/04/25 1347 01/04/25 2026 01/05/25 0500 01/05/25 0505  BP: (!) 170/64 (!) 146/58  (!) 158/60  Pulse: 83 71  71  Resp: 17 16  17   Temp: (!) 97.4 F (36.3 C) 98.3 F (36.8 C)  98.3 F (36.8 C)  TempSrc: Oral Oral  Oral  SpO2: 100% 97%  97%  Weight:   78.5 kg   Height:       No intake or output data in the 24 hours ending 01/05/25 1125 Filed Weights   01/03/25 0500 01/04/25 0500 01/05/25 0500  Weight: 79.2 kg 78 kg 78.5 kg    Examination:  General exam: Appears in no acute distress  respiratory system: Clear to auscultation. Respiratory effort normal. Cardiovascular system: S1 & S2 heard, RRR. No JVD, murmurs, rubs, gallops or clicks. No pedal edema. Gastrointestinal system: Soft tender to right lower quadrant surgical incision clean dry and intact Central  nervous system: Alert and oriented. No focal neurological deficits. Extremities: No edema   Data Reviewed: I have personally reviewed following labs and imaging studies  CBC: Recent Labs  Lab 01/01/25 0432 01/02/25 0430 01/03/25 0405  01/04/25 0413 01/05/25 0427  WBC 4.7 8.3 9.7 9.7 10.6*  NEUTROABS  --  6.7 7.8*  --   --   HGB 10.5* 9.2* 9.6* 9.6* 10.0*  HCT 32.7* 28.5* 29.8* 29.6* 31.7*  MCV 105.8* 104.8* 104.6* 103.1* 104.6*  PLT 101* 105* 109* 112* 125*   Basic Metabolic Panel: Recent Labs  Lab 12/31/24 0842 01/01/25 0805 01/02/25 0430 01/03/25 0405 01/04/25 0413 01/05/25 0427  NA 146* 147* 151* 145 146* 143  K 5.0 4.5 4.3 3.9 4.0 4.0  CL 112* 114* 118* 113* 113* 113*  CO2 21* 24 23 23 22  20*  GLUCOSE 188* 181* 177* 162* 147* 162*  BUN 35* 28* 29* 29* 28* 27*  CREATININE 1.64* 1.44* 1.38* 1.28* 1.24 1.21  CALCIUM 8.7* 8.7* 8.4* 8.3* 8.7* 8.6*  MG 2.6*  --  2.7*  --  3.0* 2.7*   GFR: Estimated Creatinine Clearance: 54.5 mL/min (by C-G formula based on SCr of 1.21 mg/dL). Liver Function Tests: Recent Labs  Lab 01/03/25 0405  AST 24  ALT 14  ALKPHOS 90  BILITOT 0.4  PROT 5.8*  ALBUMIN  3.2*   No results for input(s): LIPASE, AMYLASE in the last 168 hours. No results for input(s): AMMONIA in the last 168 hours. Coagulation Profile: No results for input(s): INR, PROTIME in the last 168 hours. Cardiac Enzymes: No results for input(s): CKTOTAL, CKMB, CKMBINDEX, TROPONINI in the last 168 hours. BNP (last 3 results) No results for input(s): PROBNP in the last 8760 hours. HbA1C: No results for input(s): HGBA1C in the last 72 hours. CBG: Recent Labs  Lab 01/02/25 0103  GLUCAP 154*   Lipid Profile: No results for input(s): CHOL, HDL, LDLCALC, TRIG, CHOLHDL, LDLDIRECT in the last 72 hours. Thyroid Function Tests: No results for input(s): TSH, T4TOTAL, FREET4, T3FREE, THYROIDAB in the last 72 hours. Anemia Panel: No results for input(s): VITAMINB12, FOLATE, FERRITIN, TIBC, IRON, RETICCTPCT in the last 72 hours. Sepsis Labs: No results for input(s): PROCALCITON, LATICACIDVEN in the last 168 hours.  No results found for this or any  previous visit (from the past 240 hours).   Radiology Studies: No results found.   Scheduled Meds:  acetaminophen   1,000 mg Oral Q6H   docusate sodium   100 mg Oral BID   enoxaparin  (LOVENOX ) injection  40 mg Subcutaneous Q24H   feeding supplement  237 mL Oral BID BM   methocarbamol  (ROBAXIN ) injection  1,000 mg Intravenous Q8H   metoCLOPramide  (REGLAN ) injection  10 mg Intravenous Q6H   Continuous Infusions:  sodium chloride  40 mL/hr at 01/04/25 1122     LOS: 9 days   Almarie KANDICE Hoots, MD 01/05/2025, 11:25 AM   "

## 2025-01-05 NOTE — Plan of Care (Signed)
" °  Problem: Education: Goal: Knowledge of General Education information will improve Description: Including pain rating scale, medication(s)/side effects and non-pharmacologic comfort measures Outcome: Progressing   Problem: Health Behavior/Discharge Planning: Goal: Ability to manage health-related needs will improve Outcome: Progressing   Problem: Clinical Measurements: Goal: Ability to maintain clinical measurements within normal limits will improve Outcome: Progressing Goal: Will remain free from infection Outcome: Progressing Goal: Diagnostic test results will improve Outcome: Progressing Goal: Respiratory complications will improve Outcome: Progressing Goal: Cardiovascular complication will be avoided Outcome: Progressing   Problem: Activity: Goal: Risk for activity intolerance will decrease Outcome: Progressing   Problem: Nutrition: Goal: Adequate nutrition will be maintained Outcome: Progressing   Problem: Coping: Goal: Level of anxiety will decrease Outcome: Progressing   Problem: Elimination: Goal: Will not experience complications related to bowel motility Outcome: Progressing Goal: Will not experience complications related to urinary retention Outcome: Progressing   Problem: Pain Managment: Goal: General experience of comfort will improve and/or be controlled Outcome: Progressing   Problem: Safety: Goal: Ability to remain free from injury will improve Outcome: Progressing   Problem: Skin Integrity: Goal: Risk for impaired skin integrity will decrease Outcome: Progressing   Problem: Education: Goal: Knowledge of the prescribed therapeutic regimen will improve Outcome: Progressing   Problem: Bowel/Gastric: Goal: Gastrointestinal status for postoperative course will improve Outcome: Progressing   Problem: Cardiac: Goal: Ability to maintain an adequate cardiac output Outcome: Progressing Goal: Will show no evidence of cardiac arrhythmias Outcome:  Progressing   Problem: Nutritional: Goal: Will attain and maintain optimal nutritional status Outcome: Progressing   Problem: Neurological: Goal: Will regain or maintain usual level of consciousness Outcome: Progressing   Problem: Clinical Measurements: Goal: Ability to maintain clinical measurements within normal limits Outcome: Progressing Goal: Postoperative complications will be avoided or minimized Outcome: Progressing   Problem: Respiratory: Goal: Will regain and/or maintain adequate ventilation Outcome: Progressing Goal: Respiratory status will improve Outcome: Progressing   Problem: Skin Integrity: Goal: Demonstrates signs of wound healing without infection Outcome: Progressing   Problem: Urinary Elimination: Goal: Will remain free from infection Outcome: Progressing Goal: Ability to achieve and maintain adequate urine output Outcome: Progressing   Problem: Education: Goal: Knowledge of the prescribed therapeutic regimen will improve Outcome: Progressing   Problem: Bowel/Gastric: Goal: Gastrointestinal status for postoperative course will improve Outcome: Progressing   Problem: Cardiac: Goal: Ability to maintain an adequate cardiac output Outcome: Progressing Goal: Will show no evidence of cardiac arrhythmias Outcome: Progressing   Problem: Nutritional: Goal: Will attain and maintain optimal nutritional status Outcome: Progressing   Problem: Neurological: Goal: Will regain or maintain usual level of consciousness Outcome: Progressing   Problem: Clinical Measurements: Goal: Ability to maintain clinical measurements within normal limits Outcome: Progressing Goal: Postoperative complications will be avoided or minimized Outcome: Progressing   Problem: Respiratory: Goal: Will regain and/or maintain adequate ventilation Outcome: Progressing Goal: Respiratory status will improve Outcome: Progressing   Problem: Skin Integrity: Goal: Demonstrates  signs of wound healing without infection Outcome: Progressing   Problem: Urinary Elimination: Goal: Will remain free from infection Outcome: Progressing Goal: Ability to achieve and maintain adequate urine output Outcome: Progressing   "

## 2025-01-06 ENCOUNTER — Inpatient Hospital Stay (HOSPITAL_COMMUNITY)

## 2025-01-06 DIAGNOSIS — K50012 Crohn's disease of small intestine with intestinal obstruction: Secondary | ICD-10-CM | POA: Diagnosis not present

## 2025-01-06 LAB — BASIC METABOLIC PANEL WITH GFR
Anion gap: 9 (ref 5–15)
BUN: 26 mg/dL — ABNORMAL HIGH (ref 8–23)
CO2: 18 mmol/L — ABNORMAL LOW (ref 22–32)
Calcium: 8.5 mg/dL — ABNORMAL LOW (ref 8.9–10.3)
Chloride: 115 mmol/L — ABNORMAL HIGH (ref 98–111)
Creatinine, Ser: 1.3 mg/dL — ABNORMAL HIGH (ref 0.61–1.24)
GFR, Estimated: 57 mL/min — ABNORMAL LOW
Glucose, Bld: 172 mg/dL — ABNORMAL HIGH (ref 70–99)
Potassium: 4.1 mmol/L (ref 3.5–5.1)
Sodium: 143 mmol/L (ref 135–145)

## 2025-01-06 LAB — CBC
HCT: 30.2 % — ABNORMAL LOW (ref 39.0–52.0)
Hemoglobin: 9.9 g/dL — ABNORMAL LOW (ref 13.0–17.0)
MCH: 33.8 pg (ref 26.0–34.0)
MCHC: 32.8 g/dL (ref 30.0–36.0)
MCV: 103.1 fL — ABNORMAL HIGH (ref 80.0–100.0)
Platelets: 127 K/uL — ABNORMAL LOW (ref 150–400)
RBC: 2.93 MIL/uL — ABNORMAL LOW (ref 4.22–5.81)
RDW: 14.1 % (ref 11.5–15.5)
WBC: 11.8 K/uL — ABNORMAL HIGH (ref 4.0–10.5)
nRBC: 0 % (ref 0.0–0.2)

## 2025-01-06 MED ORDER — ALBUTEROL SULFATE (2.5 MG/3ML) 0.083% IN NEBU
2.5000 mg | INHALATION_SOLUTION | Freq: Four times a day (QID) | RESPIRATORY_TRACT | Status: DC
  Administered 2025-01-06 – 2025-01-07 (×3): 2.5 mg via RESPIRATORY_TRACT
  Filled 2025-01-06 (×3): qty 3

## 2025-01-06 MED ORDER — OXYCODONE HCL 5 MG PO TABS: 5.0000 mg | ORAL_TABLET | Freq: Four times a day (QID) | ORAL | Status: DC | PRN

## 2025-01-06 MED ORDER — HYDROMORPHONE HCL 1 MG/ML IJ SOLN: 0.5000 mg | INTRAMUSCULAR | Status: DC | PRN

## 2025-01-06 MED ORDER — GUAIFENESIN-DM 100-10 MG/5ML PO SYRP
5.0000 mL | ORAL_SOLUTION | ORAL | Status: DC | PRN
  Administered 2025-01-06 – 2025-01-08 (×2): 5 mL via ORAL
  Filled 2025-01-06 (×2): qty 5

## 2025-01-06 MED ORDER — METHOCARBAMOL 1000 MG/10ML IJ SOLN: 1000.0000 mg | Freq: Three times a day (TID) | INTRAMUSCULAR | Status: DC | PRN

## 2025-01-06 MED ORDER — AMLODIPINE BESYLATE 5 MG PO TABS
5.0000 mg | ORAL_TABLET | Freq: Every day | ORAL | Status: DC
  Administered 2025-01-06 – 2025-01-08 (×3): 5 mg via ORAL
  Filled 2025-01-06 (×3): qty 1

## 2025-01-06 NOTE — Progress Notes (Signed)
 "  Progress Note  7 Days Post-Op  Subjective: Patient reports having multiple BM and flatulence. Tolerated a variety of full liquids yesterday. He feels that he is having abdominal soreness that is mostly present after therapies/mobility and when he coughs. Denies n/v.   ROS  All negative with the exception of above.  Objective: Vital signs in last 24 hours: Temp:  [97.9 F (36.6 C)-98.1 F (36.7 C)] 97.9 F (36.6 C) (01/22 0451) Pulse Rate:  [66-70] 70 (01/22 0451) Resp:  [17] 17 (01/21 1248) BP: (150-154)/(54-62) 154/54 (01/22 0451) SpO2:  [97 %-100 %] 97 % (01/22 0451) Weight:  [78.1 kg] 78.1 kg (01/22 0457) Last BM Date : 01/06/25  Intake/Output from previous day: 01/21 0701 - 01/22 0700 In: 480 [P.O.:480] Out: -  Intake/Output this shift: No intake/output data recorded.  PE: General: Pleasant male who is laying in bed in NAD. HEENT: Head is normocephalic, atraumatic.  Heart: HR normal during encounter.  Lungs: Respiratory effort nonlabored on room air.  Abd: Soft with mild distention. Mild tenderness to palpation of right abdomen. Incisions C/D/I. No rebound tenderness or guarding.  Skin: Warm and dry.  Psych: A&Ox3 with an appropriate affect.    Lab Results:  Recent Labs    01/05/25 0427 01/06/25 0431  WBC 10.6* 11.8*  HGB 10.0* 9.9*  HCT 31.7* 30.2*  PLT 125* 127*   BMET Recent Labs    01/05/25 0427 01/06/25 0431  NA 143 143  K 4.0 4.1  CL 113* 115*  CO2 20* 18*  GLUCOSE 162* 172*  BUN 27* 26*  CREATININE 1.21 1.30*  CALCIUM 8.6* 8.5*   PT/INR No results for input(s): LABPROT, INR in the last 72 hours. CMP     Component Value Date/Time   NA 143 01/06/2025 0431   K 4.1 01/06/2025 0431   CL 115 (H) 01/06/2025 0431   CO2 18 (L) 01/06/2025 0431   GLUCOSE 172 (H) 01/06/2025 0431   BUN 26 (H) 01/06/2025 0431   CREATININE 1.30 (H) 01/06/2025 0431   CALCIUM 8.5 (L) 01/06/2025 0431   PROT 5.8 (L) 01/03/2025 0405   ALBUMIN  3.2 (L)  01/03/2025 0405   AST 24 01/03/2025 0405   ALT 14 01/03/2025 0405   ALKPHOS 90 01/03/2025 0405   BILITOT 0.4 01/03/2025 0405   GFRNONAA 57 (L) 01/06/2025 0431   GFRAA >60 06/29/2016 1838   Lipase     Component Value Date/Time   LIPASE 22 12/27/2024 1430       Studies/Results: DG CHEST PORT 1 VIEW Result Date: 01/06/2025 EXAM: 1 VIEW(S) XRAY OF THE CHEST 01/06/2025 02:47:00 AM COMPARISON: 10/15/2024 CLINICAL HISTORY: Pneumonia FINDINGS: LUNGS AND PLEURA: No focal pulmonary opacity. No pleural effusion. No pneumothorax. HEART AND MEDIASTINUM: No acute abnormality of the cardiac and mediastinal silhouettes. BONES AND SOFT TISSUES: No acute osseous abnormality. IMPRESSION: 1. No acute findings. Electronically signed by: Pinkie Pebbles MD 01/06/2025 02:49 AM EST RP Workstation: [id number]    Anti-infectives: Anti-infectives (From admission, onward)    Start     Dose/Rate Route Frequency Ordered Stop   12/30/24 2000  cefoTEtan  (CEFOTAN ) 2 g in sodium chloride  0.9 % 100 mL IVPB        2 g 200 mL/hr over 30 Minutes Intravenous Every 12 hours 12/30/24 1149 12/30/24 2159   12/30/24 0600  cefoTEtan  (CEFOTAN ) 2 g in sodium chloride  0.9 % 100 mL IVPB        2 g 200 mL/hr over 30 Minutes Intravenous On call to O.R.  12/29/24 1338 12/30/24 0805        Assessment/Plan POD 7, s/p lap assisted ileocecectomy,  primary umbilical hernia repair by Dr. Sheldon, 1/15 for SBO  -Afebrile. -WBC 11.8 from 10.6; HGB 9.9 from 10.0 -NGT fell out 1/19. -Tolerated FLD. Will advance to soft. Reports multiple BM and flatulence. Pain manageable and is mostly a soreness with coughing and mobility. No nausea/vomiting.  -Pathology with active chronic ileitis with transmural inflammation, fissuring ulcers, pyloric gland metaplasia and strictures. Margins apear viable and uninvolved. Benign unremarkable appendix. Benign reactive lymph nodes. Negative for dysplasia or malignancy. Consistent with Crohn's possible  Crohn's. [facility] aware.  -Continue to mobilize as tolerated. -Continue multi-modal pain control -Will continue to follow.   FEN - Soft/IVFs per primary team VTE - SCDs, Eliquis  ID - None currently needed   LOS: 10 days   I reviewed nursing notes, specialist notes, hospitalist notes, last 24 h vitals and pain scores, last 48 h intake and output, last 24 h labs and trends, and last 24 h imaging results.  Marjorie Carlyon Favre, [facility] 01/06/2025, 7:58 AM Please see Amion for pager number during day hours 7:00am-4:30pm  "

## 2025-01-06 NOTE — Plan of Care (Signed)
" °  Problem: Education: Goal: Knowledge of General Education information will improve Description: Including pain rating scale, medication(s)/side effects and non-pharmacologic comfort measures Outcome: Progressing   Problem: Health Behavior/Discharge Planning: Goal: Ability to manage health-related needs will improve Outcome: Progressing   Problem: Clinical Measurements: Goal: Ability to maintain clinical measurements within normal limits will improve Outcome: Progressing Goal: Will remain free from infection Outcome: Progressing Goal: Diagnostic test results will improve Outcome: Progressing Goal: Respiratory complications will improve Outcome: Progressing Goal: Cardiovascular complication will be avoided Outcome: Progressing   Problem: Activity: Goal: Risk for activity intolerance will decrease Outcome: Progressing   Problem: Nutrition: Goal: Adequate nutrition will be maintained Outcome: Progressing   Problem: Coping: Goal: Level of anxiety will decrease Outcome: Progressing   Problem: Elimination: Goal: Will not experience complications related to bowel motility Outcome: Progressing Goal: Will not experience complications related to urinary retention Outcome: Progressing   Problem: Pain Managment: Goal: General experience of comfort will improve and/or be controlled Outcome: Progressing   Problem: Safety: Goal: Ability to remain free from injury will improve Outcome: Progressing   Problem: Skin Integrity: Goal: Risk for impaired skin integrity will decrease Outcome: Progressing   Problem: Education: Goal: Knowledge of the prescribed therapeutic regimen will improve Outcome: Progressing   Problem: Bowel/Gastric: Goal: Gastrointestinal status for postoperative course will improve Outcome: Progressing   Problem: Cardiac: Goal: Ability to maintain an adequate cardiac output Outcome: Progressing Goal: Will show no evidence of cardiac arrhythmias Outcome:  Progressing   Problem: Nutritional: Goal: Will attain and maintain optimal nutritional status Outcome: Progressing   Problem: Neurological: Goal: Will regain or maintain usual level of consciousness Outcome: Progressing   Problem: Clinical Measurements: Goal: Ability to maintain clinical measurements within normal limits Outcome: Progressing Goal: Postoperative complications will be avoided or minimized Outcome: Progressing   Problem: Respiratory: Goal: Will regain and/or maintain adequate ventilation Outcome: Progressing Goal: Respiratory status will improve Outcome: Progressing   Problem: Skin Integrity: Goal: Demonstrates signs of wound healing without infection Outcome: Progressing   Problem: Urinary Elimination: Goal: Will remain free from infection Outcome: Progressing Goal: Ability to achieve and maintain adequate urine output Outcome: Progressing   Problem: Education: Goal: Knowledge of the prescribed therapeutic regimen will improve Outcome: Progressing   Problem: Bowel/Gastric: Goal: Gastrointestinal status for postoperative course will improve Outcome: Progressing   Problem: Cardiac: Goal: Ability to maintain an adequate cardiac output Outcome: Progressing Goal: Will show no evidence of cardiac arrhythmias Outcome: Progressing   Problem: Nutritional: Goal: Will attain and maintain optimal nutritional status Outcome: Progressing   Problem: Neurological: Goal: Will regain or maintain usual level of consciousness Outcome: Progressing   Problem: Clinical Measurements: Goal: Ability to maintain clinical measurements within normal limits Outcome: Progressing Goal: Postoperative complications will be avoided or minimized Outcome: Progressing   Problem: Respiratory: Goal: Will regain and/or maintain adequate ventilation Outcome: Progressing Goal: Respiratory status will improve Outcome: Progressing   Problem: Skin Integrity: Goal: Demonstrates  signs of wound healing without infection Outcome: Progressing   Problem: Urinary Elimination: Goal: Will remain free from infection Outcome: Progressing Goal: Ability to achieve and maintain adequate urine output Outcome: Progressing   "

## 2025-01-06 NOTE — Progress Notes (Signed)
 " PROGRESS NOTE    Timothy Peterson  FMW:[id number] DOB: Nov 04, 1946 DOA: 12/27/2024 PCP: [facility]  Brief Narrative:   79 year old with history of hypertension, hyperlipidemia, A-flutter on Eliquis  who has been followed by gastroenterology for the last several months with history of diarrhea and enteritis.  Patient also reported 20 pound weight loss last 5 years.  Recent EGD and colonoscopy, capsule endoscopy on 1/9.  After about 24 hours of capsule endoscopy patient started having lower abdominal discomfort with nausea hiccups and burping.  In the emergency room hemodynamically stable.  Complaining of abdominal pain relieved with morphine .  Creatinine 1.6 with recent creatinine of 1.1.   CT scan abdomen pelvis with high-grade small bowel obstruction, capsule endoscopy still at the ileum.  Admitted with GI and surgery consultation. Patient did not improve with conservative management. 1/15, underwent open surgical ileocolectomy and anastomosis. Patient remains in the hospital due to postoperative ileus and poor bowel function. 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 Chronic abdominal pain with enteritis: Pathology findings consistent with Crohn's disease.  Active chronic ileitis with transmural inflammation fissuring ulcers pyloric gland metaplasia and strictures.  Negative for malignancy.  He underwent surgical resection open ileocolectomy and anastomosis GI and general surgery following.SABRA He reports having multiple bowel movements overnight.  DCD stool softeners.May need  SNF await PT eval patient lives at home  with his wife Out of bed ambulate encourage  AKI from dehydration: RESOLVED with IV fluids.     paroxysmal A-flutter: Continue Eliquis  rate controlled not on any rate limiting agents.   Essential hypertension: Restarted Norvasc  at 5 mg daily lisinopril  on hold blood pressure starting to trend up    Hyperlipidemia: On statins.  Hold until oral intake.   Estimated body mass index is 24 kg/m as calculated from the following:   Height as of this encounter: 5' 11 (1.803 m).   Weight as of this encounter: 78.1 kg.  DVT prophylaxis: Eliquis  Code Status: Full code Family Communication: None Disposition Plan:  Status is: Inpatient Remains inpatient appropriate because: Acute illness   Consultants: General Surgery  Procedures: Open ileocolectomy and anastomosis Antimicrobials: None  Subjective: Complains of abdominal pain when coughing denies shortness of breath bringing up clear phlegm chest x-ray from today clear encouraged him to use incentive spirometer which he has not been using   objective: Vitals:   01/05/25 1248 01/05/25 1954 01/06/25 0451 01/06/25 0457  BP: (!) 150/62 (!) 150/57 (!) 154/54   Pulse: 68 66 70   Resp: 17     Temp: 98.1 F (36.7 C) 98.1 F (36.7 C) 97.9 F (36.6 C)   TempSrc: Oral Oral Oral   SpO2: 100% 98% 97%   Weight:    78.1 kg  Height:        Intake/Output Summary (Last 24 hours) at 01/06/2025 1315 Last data filed at 01/06/2025 1130 Gross per 24 hour  Intake 1440 ml  Output --  Net 1440 ml   Filed Weights   01/04/25 0500 01/05/25 0500 01/06/25 0457  Weight: 78 kg 78.5 kg 78.1 kg    Examination:  General exam: Appears in  no acute distress  respiratory system: Clear to auscultation. Respiratory effort normal. Cardiovascular system: S1 & S2 heard, RRR. No JVD, murmurs, rubs, gallops or clicks. No pedal edema. Gastrointestinal system: Soft tender to right lower quadrant surgical incision clean dry and intact Central nervous system: Alert  and oriented. No focal neurological deficits. Extremities: No edema   Data Reviewed: I have personally reviewed following labs and imaging studies  CBC: Recent Labs  Lab 01/02/25 0430 01/03/25 0405 01/04/25 0413 01/05/25 0427 01/06/25 0431  WBC 8.3 9.7 9.7 10.6* 11.8*  NEUTROABS 6.7 7.8*  --   --   --   HGB 9.2* 9.6* 9.6* 10.0* 9.9*  HCT 28.5* 29.8* 29.6* 31.7* 30.2*  MCV 104.8* 104.6* 103.1* 104.6* 103.1*  PLT 105* 109* 112* 125* 127*   Basic Metabolic Panel: Recent Labs  Lab 12/31/24 0842 01/01/25 0805 01/02/25 0430 01/03/25 0405 01/04/25 0413 01/05/25 0427 01/06/25 0431  NA 146*   < > 151* 145 146* 143 143  K 5.0   < > 4.3 3.9 4.0 4.0 4.1  CL 112*   < > 118* 113* 113* 113* 115*  CO2 21*   < > 23 23 22  20* 18*  GLUCOSE 188*   < > 177* 162* 147* 162* 172*  BUN 35*   < > 29* 29* 28* 27* 26*  CREATININE 1.64*   < > 1.38* 1.28* 1.24 1.21 1.30*  CALCIUM 8.7*   < > 8.4* 8.3* 8.7* 8.6* 8.5*  MG 2.6*  --  2.7*  --  3.0* 2.7*  --    < > = values in this interval not displayed.   GFR: Estimated Creatinine Clearance: 50.7 mL/min (A) (by C-G formula based on SCr of 1.3 mg/dL (H)). Liver Function Tests: Recent Labs  Lab 01/03/25 0405  AST 24  ALT 14  ALKPHOS 90  BILITOT 0.4  PROT 5.8*  ALBUMIN  3.2*   No results for input(s): LIPASE, AMYLASE in the last 168 hours. No results for input(s): AMMONIA in the last 168 hours. Coagulation Profile: No results for input(s): INR, PROTIME in the last 168 hours. Cardiac Enzymes: No results for input(s): CKTOTAL, CKMB, CKMBINDEX, TROPONINI in the last 168 hours. BNP (last 3 results) No results for input(s): PROBNP in the last 8760 hours. HbA1C: No results for input(s): HGBA1C in the last 72 hours. CBG: Recent Labs  Lab 01/02/25 0103  GLUCAP 154*   Lipid Profile: No results for input(s): CHOL, HDL, LDLCALC, TRIG, CHOLHDL, LDLDIRECT in the last 72 hours. Thyroid Function Tests: No  results for input(s): TSH, T4TOTAL, FREET4, T3FREE, THYROIDAB in the last 72 hours. Anemia Panel: No results for input(s): VITAMINB12, FOLATE, FERRITIN, TIBC, IRON, RETICCTPCT in the last 72 hours. Sepsis Labs: No results for input(s): PROCALCITON, LATICACIDVEN in the last 168 hours.  No results found for this or any previous visit (from the past 240 hours).   Radiology Studies: DG CHEST PORT 1 VIEW Result Date: 01/06/2025 EXAM: 1 VIEW(S) XRAY OF THE CHEST 01/06/2025 02:47:00 AM COMPARISON: 10/15/2024 CLINICAL HISTORY: Pneumonia FINDINGS: LUNGS AND PLEURA: No focal pulmonary opacity. No pleural effusion. No pneumothorax. HEART AND MEDIASTINUM: No acute abnormality of the cardiac and mediastinal silhouettes. BONES AND SOFT TISSUES: No acute osseous abnormality. IMPRESSION: 1. No acute findings. Electronically signed by: Pinkie Pebbles MD 01/06/2025 02:49 AM EST RP Workstation: [id number]     Scheduled Meds:  acetaminophen   1,000 mg Oral Q6H   apixaban   5 mg Oral BID   feeding supplement  237 mL Oral  BID BM   Continuous Infusions:     LOS: 10 days   Almarie KANDICE Hoots, MD 01/06/2025, 1:15 PM   "

## 2025-01-07 DIAGNOSIS — K50012 Crohn's disease of small intestine with intestinal obstruction: Secondary | ICD-10-CM | POA: Diagnosis not present

## 2025-01-07 LAB — COMPREHENSIVE METABOLIC PANEL WITH GFR
ALT: 17 U/L (ref 0–44)
AST: 21 U/L (ref 15–41)
Albumin: 2.9 g/dL — ABNORMAL LOW (ref 3.5–5.0)
Alkaline Phosphatase: 98 U/L (ref 38–126)
Anion gap: 11 (ref 5–15)
BUN: 26 mg/dL — ABNORMAL HIGH (ref 8–23)
CO2: 18 mmol/L — ABNORMAL LOW (ref 22–32)
Calcium: 8.3 mg/dL — ABNORMAL LOW (ref 8.9–10.3)
Chloride: 111 mmol/L (ref 98–111)
Creatinine, Ser: 1.33 mg/dL — ABNORMAL HIGH (ref 0.61–1.24)
GFR, Estimated: 55 mL/min — ABNORMAL LOW
Glucose, Bld: 143 mg/dL — ABNORMAL HIGH (ref 70–99)
Potassium: 3.7 mmol/L (ref 3.5–5.1)
Sodium: 140 mmol/L (ref 135–145)
Total Bilirubin: 0.4 mg/dL (ref 0.0–1.2)
Total Protein: 5.6 g/dL — ABNORMAL LOW (ref 6.5–8.1)

## 2025-01-07 LAB — CBC
HCT: 29.7 % — ABNORMAL LOW (ref 39.0–52.0)
Hemoglobin: 9.5 g/dL — ABNORMAL LOW (ref 13.0–17.0)
MCH: 33.3 pg (ref 26.0–34.0)
MCHC: 32 g/dL (ref 30.0–36.0)
MCV: 104.2 fL — ABNORMAL HIGH (ref 80.0–100.0)
Platelets: 128 K/uL — ABNORMAL LOW (ref 150–400)
RBC: 2.85 MIL/uL — ABNORMAL LOW (ref 4.22–5.81)
RDW: 13.9 % (ref 11.5–15.5)
WBC: 16.9 K/uL — ABNORMAL HIGH (ref 4.0–10.5)
nRBC: 0 % (ref 0.0–0.2)

## 2025-01-07 MED ORDER — ALBUTEROL SULFATE (2.5 MG/3ML) 0.083% IN NEBU: 2.5000 mg | INHALATION_SOLUTION | Freq: Four times a day (QID) | RESPIRATORY_TRACT | Status: DC | PRN

## 2025-01-07 MED ORDER — ALUM & MAG HYDROXIDE-SIMETH 200-200-20 MG/5ML PO SUSP
30.0000 mL | ORAL | Status: DC | PRN
  Administered 2025-01-07: 30 mL via ORAL
  Filled 2025-01-07: qty 30

## 2025-01-07 NOTE — Plan of Care (Signed)
" °  Problem: Clinical Measurements: Goal: Respiratory complications will improve Outcome: Progressing Goal: Cardiovascular complication will be avoided Outcome: Progressing   Problem: Activity: Goal: Risk for activity intolerance will decrease Outcome: Progressing   Problem: Coping: Goal: Level of anxiety will decrease Outcome: Progressing   Problem: Elimination: Goal: Will not experience complications related to bowel motility Outcome: Progressing Goal: Will not experience complications related to urinary retention Outcome: Progressing   Problem: Safety: Goal: Ability to remain free from injury will improve Outcome: Progressing   Problem: Skin Integrity: Goal: Risk for impaired skin integrity will decrease Outcome: Progressing   Problem: Bowel/Gastric: Goal: Gastrointestinal status for postoperative course will improve Outcome: Progressing   Problem: Neurological: Goal: Will regain or maintain usual level of consciousness Outcome: Progressing   Problem: Skin Integrity: Goal: Demonstrates signs of wound healing without infection Outcome: Progressing   Problem: Cardiac: Goal: Ability to maintain an adequate cardiac output Outcome: Progressing   "

## 2025-01-07 NOTE — Progress Notes (Signed)
 Physical Therapy Treatment Patient Details Name: Timothy Peterson MRN: [id number] DOB: May 12, 1946 Today's Date: 01/07/2025   History of Present Illness Pt s/p laparoscopic partial colectomy 2* SBO and with incarcerated umbilical hernia repair performed 12/30/24.  Pt with hx of htn and gouty arthritis    PT Comments   The patient  is eager to Dc home. He is ambulating using Rw, gait slow and steady. Patient reports having   either 2 wheeled or 4 wheeled RW.  The patient states he  does not prefer HHPT at this time.    If plan is discharge home, recommend the following: A little help with walking and/or transfers;A little help with bathing/dressing/bathroom;Assistance with cooking/housework;Assist for transportation;Help with stairs or ramp for entrance   Can travel by private vehicle        Equipment Recommendations  None recommended by PT    Recommendations for Other Services       Precautions / Restrictions Precautions Precautions: Fall Restrictions Weight Bearing Restrictions Per Provider Order: No     Mobility  Bed Mobility   Bed Mobility: Rolling, Sidelying to Sit, Sit to Sidelying Rolling: Supervision Sidelying to sit: Min assist     Sit to sidelying: Min assist General bed mobility comments: Increased time with cues for proper log roll technique    Transfers Overall transfer level: Needs assistance Equipment used: Rolling walker (2 wheels) Transfers: Sit to/from Stand Sit to Stand: Supervision           General transfer comment: from bed and toilet    Ambulation/Gait Ambulation/Gait assistance: Contact guard assist Gait Distance (Feet): 80 Feet Assistive device: Rolling walker (2 wheels) Gait Pattern/deviations: Step-through pattern, Decreased step length - right, Decreased step length - left, Shuffle, Trunk flexed       General Gait Details: Increased time with cues for posture and position from [employer]             Wheelchair  Mobility     Tilt Bed    Modified Rankin (Stroke Patients Only)       Balance     Sitting balance-Leahy Scale: Good     Standing balance support: No upper extremity supported Standing balance-Leahy Scale: Fair                              Communication    Cognition Arousal: Alert Behavior During Therapy: WFL for tasks assessed/performed   PT - Cognitive impairments: No apparent impairments                                Cueing    Exercises      General Comments        Pertinent Vitals/Pain Pain Assessment Faces Pain Scale: Hurts little more Pain Location: abdominal pain with coughing Pain Descriptors / Indicators: Grimacing, Guarding Pain Intervention(s): Monitored during session, Premedicated before session    Home Living                          Prior Function            PT Goals (current goals can now be found in the care plan section) Progress towards PT goals: Progressing toward goals    Frequency    Min 3X/week      PT Plan      Co-evaluation  AM-PAC PT 6 Clicks Mobility   Outcome Measure  Help needed turning from your back to your side while in a flat bed without using bedrails?: A Little Help needed moving from lying on your back to sitting on the side of a flat bed without using bedrails?: A Little Help needed moving to and from a bed to a chair (including a wheelchair)?: A Little Help needed standing up from a chair using your arms (e.g., wheelchair or bedside chair)?: A Little Help needed to walk in hospital room?: A Little Help needed climbing 3-5 steps with a railing? : A Lot 6 Click Score: 17    End of Session Equipment Utilized During Treatment: Gait belt Activity Tolerance: Patient tolerated treatment well Patient left: in bed;with call bell/phone within reach;with bed alarm set Nurse Communication: Mobility status PT Visit Diagnosis: Difficulty in walking, not  elsewhere classified (R26.2)     Time: 8977-8954 PT Time Calculation (min) (ACUTE ONLY): 23 min  Charges:    $Gait Training: 23-37 mins PT General Charges $$ ACUTE PT VISIT: 1 Visit                     Darice Potters PT Acute Rehabilitation Services Office [phone]    Potters Darice Norris 01/07/2025, 1:53 PM

## 2025-01-07 NOTE — Progress Notes (Signed)
 " PROGRESS NOTE    Timothy Peterson  FMW:[id number] DOB: 1946-06-23 DOA: 12/27/2024 PCP: [facility]  Brief Narrative:   79 year old with history of hypertension, hyperlipidemia, A-flutter on Eliquis  who has been followed by gastroenterology for the last several months with history of diarrhea and enteritis.  Patient also reported 20 pound weight loss last 5 years.  Recent EGD and colonoscopy, capsule endoscopy on 1/9.  After about 24 hours of capsule endoscopy patient started having lower abdominal discomfort with nausea hiccups and burping.  In the emergency room hemodynamically stable.  Complaining of abdominal pain relieved with morphine .  Creatinine 1.6 with recent creatinine of 1.1.   CT scan abdomen pelvis with high-grade small bowel obstruction, capsule endoscopy still at the ileum.  Admitted with GI and surgery consultation. Patient did not improve with conservative management. 1/15, underwent open surgical ileocolectomy and anastomosis. Patient remains in the hospital due to postoperative ileus and poor bowel function. 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 Chronic abdominal pain with enteritis: Pathology findings consistent with Crohn's disease.  Active chronic ileitis with transmural inflammation fissuring ulcers pyloric gland metaplasia and strictures.  Negative for malignancy.  He underwent surgical resection open ileocolectomy and anastomosis GI and general surgery following.  General surgery is advancing his diet.  Physical therapy has seen the patient and recommending home health on discharge.  Will  wait to see how his oral intake goes over with advancing diet if all improved consider DC home tomorrow.  AKI from dehydration: RESOLVED with IV fluids.     paroxysmal A-flutter: Continue Eliquis  rate controlled not on any rate limiting agents.   Essential hypertension: Continue Norvasc    hyperlipidemia: On statins.  Hold until oral intake.   Estimated body mass index is 24 kg/m as calculated from the following:   Height as of this encounter: 5' 11 (1.803 m).   Weight as of this encounter: 78.1 kg.  DVT prophylaxis: Eliquis  Code Status: Full code Family Communication: None Disposition Plan:  Status is: Inpatient Remains inpatient appropriate because: Acute illness   Consultants: General Surgery  Procedures: Open ileocolectomy and anastomosis Antimicrobials: None  Subjective: Feels better than yesterday diarrhea improved white count 16.9 hemoglobin 9.5 platelets 128 creatinine 1.33 objective: Vitals:   01/06/25 1811 01/06/25 2011 01/07/25 0541 01/07/25 0744  BP: (!) 136/59 (!) 140/53 (!) 141/54   Pulse: 100 75 73   Resp: 19 18 17    Temp: 97.6 F (36.4 C) 97.8 F (36.6 C) 97.9 F (36.6 C)   TempSrc: Oral Oral Oral   SpO2: 98% 97% 98% 95%  Weight:      Height:        Intake/Output Summary (Last 24 hours) at 01/07/2025 1132 Last data filed at 01/06/2025 1800 Gross per 24 hour  Intake 1200 ml  Output --  Net 1200 ml   Filed Weights   01/04/25 0500 01/05/25 0500 01/06/25 0457  Weight: 78 kg 78.5 kg 78.1 kg    Examination:  General exam: Appears in no acute distress  respiratory system: Clear to auscultation. Respiratory effort normal. Cardiovascular system: S1 & S2 heard, RRR. No JVD,  murmurs, rubs, gallops or clicks. No pedal edema. Gastrointestinal system: Soft tender to right lower quadrant surgical incision clean dry and intact Central nervous system: Alert and oriented. No focal neurological deficits. Extremities: No edema   Data Reviewed: I have  personally reviewed following labs and imaging studies  CBC: Recent Labs  Lab 01/02/25 0430 01/03/25 0405 01/04/25 0413 01/05/25 0427 01/06/25 0431 01/07/25 0438  WBC 8.3 9.7 9.7 10.6* 11.8* 16.9*  NEUTROABS 6.7 7.8*  --   --   --   --   HGB 9.2* 9.6* 9.6* 10.0* 9.9* 9.5*  HCT 28.5* 29.8* 29.6* 31.7* 30.2* 29.7*  MCV 104.8* 104.6* 103.1* 104.6* 103.1* 104.2*  PLT 105* 109* 112* 125* 127* 128*   Basic Metabolic Panel: Recent Labs  Lab 01/02/25 0430 01/03/25 0405 01/04/25 0413 01/05/25 0427 01/06/25 0431 01/07/25 0438  NA 151* 145 146* 143 143 140  K 4.3 3.9 4.0 4.0 4.1 3.7  CL 118* 113* 113* 113* 115* 111  CO2 23 23 22  20* 18* 18*  GLUCOSE 177* 162* 147* 162* 172* 143*  BUN 29* 29* 28* 27* 26* 26*  CREATININE 1.38* 1.28* 1.24 1.21 1.30* 1.33*  CALCIUM 8.4* 8.3* 8.7* 8.6* 8.5* 8.3*  MG 2.7*  --  3.0* 2.7*  --   --    GFR: Estimated Creatinine Clearance: 49.5 mL/min (A) (by C-G formula based on SCr of 1.33 mg/dL (H)). Liver Function Tests: Recent Labs  Lab 01/03/25 0405 01/07/25 0438  AST 24 21  ALT 14 17  ALKPHOS 90 98  BILITOT 0.4 0.4  PROT 5.8* 5.6*  ALBUMIN  3.2* 2.9*   No results for input(s): LIPASE, AMYLASE in the last 168 hours. No results for input(s): AMMONIA in the last 168 hours. Coagulation Profile: No results for input(s): INR, PROTIME in the last 168 hours. Cardiac Enzymes: No results for input(s): CKTOTAL, CKMB, CKMBINDEX, TROPONINI in the last 168 hours. BNP (last 3 results) No results for input(s): PROBNP in the last 8760 hours. HbA1C: No results for input(s): HGBA1C in the last 72 hours. CBG: Recent Labs  Lab 01/02/25 0103  GLUCAP 154*   Lipid Profile: No results for input(s): CHOL, HDL, LDLCALC, TRIG, CHOLHDL, LDLDIRECT in the last 72 hours. Thyroid Function Tests: No results for input(s): TSH, T4TOTAL, FREET4, T3FREE, THYROIDAB in the last 72 hours. Anemia Panel: No results for  input(s): VITAMINB12, FOLATE, FERRITIN, TIBC, IRON, RETICCTPCT in the last 72 hours. Sepsis Labs: No results for input(s): PROCALCITON, LATICACIDVEN in the last 168 hours.  No results found for this or any previous visit (from the past 240 hours).   Radiology Studies: DG CHEST PORT 1 VIEW Result Date: 01/06/2025 EXAM: 1 VIEW(S) XRAY OF THE CHEST 01/06/2025 02:47:00 AM COMPARISON: 10/15/2024 CLINICAL HISTORY: Pneumonia FINDINGS: LUNGS AND PLEURA: No focal pulmonary opacity. No pleural effusion. No pneumothorax. HEART AND MEDIASTINUM: No acute abnormality of the cardiac and mediastinal silhouettes. BONES AND SOFT TISSUES: No acute osseous abnormality. IMPRESSION: 1. No acute findings. Electronically signed by: Pinkie Pebbles MD 01/06/2025 02:49 AM EST RP Workstation: [id number]     Scheduled Meds:  acetaminophen   1,000 mg Oral Q6H   amLODipine   5 mg Oral Daily   apixaban   5 mg Oral BID   feeding supplement  237 mL Oral BID BM   Continuous Infusions:     LOS: 11 days   Almarie KANDICE Hoots, MD 01/07/2025, 11:32 AM   "

## 2025-01-07 NOTE — Progress Notes (Signed)
 "  Progress Note  8 Days Post-Op  Subjective: Patient reports having a BM this AM and flatulence. Denies worsening pain. Tolerated soft diet with n/v.   ROS  All negative with the exception of above.  Objective: Vital signs in last 24 hours: Temp:  [97.6 F (36.4 C)-97.9 F (36.6 C)] 97.9 F (36.6 C) (01/23 0541) Pulse Rate:  [73-100] 73 (01/23 0541) Resp:  [17-19] 17 (01/23 0541) BP: (136-141)/(53-59) 141/54 (01/23 0541) SpO2:  [95 %-99 %] 95 % (01/23 0744) Last BM Date : 01/06/25  Intake/Output from previous day: 01/22 0701 - 01/23 0700 In: 2160 [P.O.:2160] Out: -  Intake/Output this shift: No intake/output data recorded.  PE: General: Pleasant male who is laying in bed in NAD. HEENT: Head is normocephalic, atraumatic.  Heart: HR normal during encounter.  Lungs: Respiratory effort nonlabored on room air.  Abd: Soft with mild distention. Mild tenderness to palpation of right abdomen. Incisions C/D/I. No rebound tenderness or guarding.  Skin: Warm and dry.  Psych: A&Ox3 with an appropriate affect.   Lab Results:  Recent Labs    01/06/25 0431 01/07/25 0438  WBC 11.8* 16.9*  HGB 9.9* 9.5*  HCT 30.2* 29.7*  PLT 127* 128*   BMET Recent Labs    01/06/25 0431 01/07/25 0438  NA 143 140  K 4.1 3.7  CL 115* 111  CO2 18* 18*  GLUCOSE 172* 143*  BUN 26* 26*  CREATININE 1.30* 1.33*  CALCIUM 8.5* 8.3*   PT/INR No results for input(s): LABPROT, INR in the last 72 hours. CMP     Component Value Date/Time   NA 140 01/07/2025 0438   K 3.7 01/07/2025 0438   CL 111 01/07/2025 0438   CO2 18 (L) 01/07/2025 0438   GLUCOSE 143 (H) 01/07/2025 0438   BUN 26 (H) 01/07/2025 0438   CREATININE 1.33 (H) 01/07/2025 0438   CALCIUM 8.3 (L) 01/07/2025 0438   PROT 5.6 (L) 01/07/2025 0438   ALBUMIN  2.9 (L) 01/07/2025 0438   AST 21 01/07/2025 0438   ALT 17 01/07/2025 0438   ALKPHOS 98 01/07/2025 0438   BILITOT 0.4 01/07/2025 0438   GFRNONAA 55 (L) 01/07/2025 0438    GFRAA >60 06/29/2016 1838   Lipase     Component Value Date/Time   LIPASE 22 12/27/2024 1430       Studies/Results: DG CHEST PORT 1 VIEW Result Date: 01/06/2025 EXAM: 1 VIEW(S) XRAY OF THE CHEST 01/06/2025 02:47:00 AM COMPARISON: 10/15/2024 CLINICAL HISTORY: Pneumonia FINDINGS: LUNGS AND PLEURA: No focal pulmonary opacity. No pleural effusion. No pneumothorax. HEART AND MEDIASTINUM: No acute abnormality of the cardiac and mediastinal silhouettes. BONES AND SOFT TISSUES: No acute osseous abnormality. IMPRESSION: 1. No acute findings. Electronically signed by: Pinkie Pebbles MD 01/06/2025 02:49 AM EST RP Workstation: [id number]    Anti-infectives: Anti-infectives (From admission, onward)    Start     Dose/Rate Route Frequency Ordered Stop   12/30/24 2000  cefoTEtan  (CEFOTAN ) 2 g in sodium chloride  0.9 % 100 mL IVPB        2 g 200 mL/hr over 30 Minutes Intravenous Every 12 hours 12/30/24 1149 12/30/24 2159   12/30/24 0600  cefoTEtan  (CEFOTAN ) 2 g in sodium chloride  0.9 % 100 mL IVPB        2 g 200 mL/hr over 30 Minutes Intravenous On call to O.R. 12/29/24 1338 12/30/24 0805        Assessment/Plan POD 8, s/p lap assisted ileocecectomy,  primary umbilical hernia repair by Dr. Sheldon, 1/15  for SBO  -Afebrile. -WBC 16.9 from 11.8; HGB 9.5 from 9.9 -NGT fell out 1/19. -Tolerated soft diet. Will advance to regular. Reports BM and flatulence. Pain manageable and is mostly a soreness. No nausea/vomiting.  -Pathology with active chronic ileitis with transmural inflammation, fissuring ulcers, pyloric gland metaplasia and strictures. Margins apear viable and uninvolved. Benign unremarkable appendix. Benign reactive lymph nodes. Negative for dysplasia or malignancy. Consistent with Crohn's possible Crohn's. [facility] aware.  -Continue to mobilize as tolerated. -Continue multi-modal pain control -If tolerates regular diet, stable from general surgery standpoint. Discussed post-operative  expectations and restrictions in great detail. Patient knows to call to confirm his follow up visit with GI and general surgery. Precautions provided.   FEN - Soft and can advance to regular/IVFs per primary team VTE - SCDs, Eliquis  ID - None currently needed   LOS: 11 days   I reviewed specialist notes, hospitalist notes, last 24 h vitals and pain scores, last 48 h intake and output, last 24 h labs and trends, and last 24 h imaging results.  Marjorie Carlyon Favre, [facility] 01/07/2025, 9:24 AM Please see Amion for pager number during day hours 7:00am-4:30pm  "

## 2025-01-08 DIAGNOSIS — K50012 Crohn's disease of small intestine with intestinal obstruction: Secondary | ICD-10-CM | POA: Diagnosis not present

## 2025-01-08 LAB — CBC
HCT: 27.9 % — ABNORMAL LOW (ref 39.0–52.0)
Hemoglobin: 9 g/dL — ABNORMAL LOW (ref 13.0–17.0)
MCH: 33.3 pg (ref 26.0–34.0)
MCHC: 32.3 g/dL (ref 30.0–36.0)
MCV: 103.3 fL — ABNORMAL HIGH (ref 80.0–100.0)
Platelets: 126 10*3/uL — ABNORMAL LOW (ref 150–400)
RBC: 2.7 MIL/uL — ABNORMAL LOW (ref 4.22–5.81)
RDW: 14.2 % (ref 11.5–15.5)
WBC: 13.1 10*3/uL — ABNORMAL HIGH (ref 4.0–10.5)
nRBC: 0 % (ref 0.0–0.2)

## 2025-01-08 MED ORDER — POLYETHYLENE GLYCOL 3350 17 G PO PACK: 17.0000 g | PACK | Freq: Two times a day (BID) | ORAL | Status: DC

## 2025-01-08 MED ORDER — OXYCODONE HCL 5 MG PO TABS: 5.0000 mg | ORAL_TABLET | Freq: Four times a day (QID) | ORAL | 0 refills | Status: AC | PRN

## 2025-01-08 MED ORDER — POLYETHYLENE GLYCOL 3350 17 G PO PACK: 17.0000 g | PACK | Freq: Two times a day (BID) | ORAL | 0 refills | Status: AC

## 2025-01-08 MED ORDER — FAMOTIDINE IN NACL 20-0.9 MG/50ML-% IV SOLN
20.0000 mg | Freq: Once | INTRAVENOUS | Status: AC
  Administered 2025-01-08: 20 mg via INTRAVENOUS
  Filled 2025-01-08: qty 50

## 2025-01-08 NOTE — Discharge Summary (Signed)
 Physician Discharge Summary  Timothy Peterson FMW:[id number] DOB: 03/28/1946 DOA: 12/27/2024  PCP: [facility]  Admit date: 12/27/2024 Discharge date: 01/08/2025  Admitted From: Home Disposition: Home Recommendations for Outpatient Follow-up:  Follow up with PCP in 1-2 weeks Please obtain BMP/CBC in one week  Home Health: None Equipment/Devices: None Discharge Condition stable CODE STATUS: Full code Diet recommendation: Cardiac  brief/Interim Summary:  79 year old with history of hypertension, hyperlipidemia, A-flutter on Eliquis  who has been followed by gastroenterology for the last several months with history of diarrhea and enteritis.  Patient also reported 20 pound weight loss last 5 years.  Recent EGD and colonoscopy, capsule endoscopy on 1/9.  After about 24 hours of capsule endoscopy patient started having lower abdominal discomfort with nausea hiccups and burping.  In the emergency room hemodynamically stable.  Complaining of abdominal pain relieved with morphine .  Creatinine 1.6 with recent creatinine of 1.1.   CT scan abdomen pelvis with high-grade small bowel obstruction, capsule endoscopy still at the ileum.  Admitted with GI and surgery consultation. Patient did not improve with conservative management. 1/15, underwent open surgical ileocolectomy and anastomosis.  Postop was complicated by postop ileus.  This was resolved with IV fluids and p.o. increase ambulation.  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 Chronic abdominal pain with enteritis: Pathology findings consistent with Crohn's  disease.  Active chronic ileitis with transmural inflammation fissuring ulcers pyloric gland metaplasia and strictures.  Negative for malignancy.  He underwent surgical resection open ileocolectomy and anastomosis.  His white count was trending up to 16.9 from 9.7 with concern for right lower quadrant focal tenderness.  He was closely observed with improvement in pain and trending down white count.  White count was 13.1 on day of discharge.  He did not have any fever however he had some cough bringing up clear phlegm chest x-ray showed some atelectasis no consolidation infiltrates or effusion was noted.  He was encouraged to use incentive spirometer.  [facility] after he gets home for home health PT.   AKI from dehydration: RESOLVED with IV fluids.     paroxysmal A-flutter: Continue Eliquis  rate controlled not on any rate limiting agents.   Essential hypertension: Continue Norvasc   ACE inhibitor was on hold at the time of discharge and during hospital stay due to soft blood pressure as well as a creatinine of 1.3.  Follow-up renal functions as an outpatient restart if necessary.  hyperlipidemia: On statins.  Estimated body mass index is 24 kg/m as calculated from the following:   Height as of this encounter: 5' 11 (1.803 m).   Weight as of this encounter: 78.1 kg.  Discharge Instructions  Discharge Instructions     Increase activity slowly   Complete by: As directed    No wound care   Complete by: As directed       Allergies as of 01/08/2025       Reactions   Novocain [procaine] Hypertension   Sulfa Antibiotics Other (See Comments)   shaky   Protonix  [pantoprazole ] Other (See Comments)   Reports dizziness  Vardenafil Hcl Palpitations        Medication List     STOP taking these medications    lisinopril  10 MG tablet Commonly known as: ZESTRIL    PSYLLIUM PO       TAKE these medications    allopurinol 100 MG tablet Commonly known as:  ZYLOPRIM Take 100 mg by mouth daily.   amLODipine  10 MG tablet Commonly known as: NORVASC  Take 1 tablet (10 mg total) by mouth daily. What changed: when to take this   apixaban  5 MG Tabs tablet Commonly known as: ELIQUIS  Take 1 tablet (5 mg total) by mouth 2 (two) times daily.   atorvastatin 40 MG tablet Commonly known as: LIPITOR Take 40 mg by mouth at bedtime.   Carboxymethylcellulose Sodium 0.25 % Soln Place 1 drop into both eyes 4 (four) times daily as needed (for dryness).   Chloraseptic 1.4 % Liqd Generic drug: phenol Use as directed 1 spray in the mouth or throat as needed for throat irritation / pain.   cyanocobalamin  500 MCG tablet Commonly known as: VITAMIN B12 Take 500 mcg by mouth daily.   feeding supplement (BOOST BREEZE) Liqd Take 240 mLs by mouth See admin instructions. Drink 240 ml's of [employer] by mouth once a day   magnesium oxide 400 (240 Mg) MG tablet Commonly known as: MAG-OX Take 400 mg by mouth daily.   oxyCODONE  5 MG immediate release tablet Commonly known as: Oxy IR/ROXICODONE  Take 1 tablet (5 mg total) by mouth every 6 (six) hours as needed for severe pain (pain score 7-10).   pantoprazole  20 MG tablet Commonly known as: Protonix  Take 1 tablet (20 mg total) by mouth daily. What changed: when to take this   tadalafil 20 MG tablet Commonly known as: CIALIS Take 20 mg by mouth daily as needed for erectile dysfunction (one hoiur prior to activity).   Vitamin D (Cholecalciferol) 25 MCG (1000 UT) Tabs Take 1,000 Units by mouth daily.        Follow-up Information     Sheldon Standing, MD. Go on 01/31/2025.   Specialties: General Surgery, Colon and Rectal Surgery Why: Arrive 10:30AM for 10:45AM appointment, Please bring ID and insurance information to your appointment. Contact information: [address] [place] [address] [phone]         [facility] Follow [facility].   Contact information: [address] [place] [address] [phone]         Albertus Gordy HERO, MD Follow up.   Specialty: Gastroenterology Contact information: [address] [place] [place] [address] [phone]                Allergies[1]  Consultations: GI and general surgery   Procedures/Studies: DG CHEST PORT 1 VIEW Result Date: 01/06/2025 EXAM: 1 VIEW(S) XRAY OF THE CHEST 01/06/2025 02:47:00 AM COMPARISON: 10/15/2024 CLINICAL HISTORY: Pneumonia FINDINGS: LUNGS AND PLEURA: No focal pulmonary opacity. No pleural effusion. No pneumothorax. HEART AND MEDIASTINUM: No acute abnormality of the cardiac and mediastinal silhouettes. BONES AND SOFT TISSUES: No acute osseous abnormality. IMPRESSION: 1. No acute findings. Electronically signed by: Pinkie Pebbles MD 01/06/2025 02:49 AM EST RP Workstation: [id number]   CT HEAD WO CONTRAST ( ) Result Date: 01/02/2025 EXAM: CT HEAD WITHOUT CONTRAST 01/02/2025 01:38:15 AM TECHNIQUE: CT of the head was performed without the administration of intravenous contrast. Automated exposure control, iterative reconstruction, and/or weight based adjustment of the mA/kV was utilized to reduce the radiation dose to as low as reasonably  achievable. COMPARISON: MRI 01/29/2007. 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 No acute hemorrhage. No evidence of acute infarct. No hydrocephalus. No extra-axial collection. No mass effect or midline shift. ORBITS: No acute abnormality. SINUSES: No acute abnormality. SOFT TISSUES AND SKULL: No acute soft tissue abnormality. No skull fracture. IMPRESSION: 1. No acute intracranial abnormality. 2. 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 Electronically signed by: Franky Crease MD 01/02/2025 01:41 AM EST RP Workstation: [id number]   DG Abd 1 View Result Date: 12/31/2024 EXAM: 1 VIEW XRAY OF THE  ABDOMEN 12/31/2024 10:55:00 PM COMPARISON: 12/30/2024 CLINICAL HISTORY: [id number] Encounter for nasogastric tube placement [id number] Encounter for nasogastric tube placement [id number] Encounter for nasogastric tube placement [id number] Encounter for nasogastric tube placement [id number] Encounter for nasogastric tube placement [phone] FINDINGS: LINES, TUBES AND DEVICES: Enteric tube in place with distal tip and side port terminating within the expected location of the gastric body. BOWEL: Dilated loops of small bowel measuring at least 4.2 cm in diameter, similar to the prior study, consistent with small bowel obstruction. SOFT TISSUES: No abnormal calcifications. BONES: No acute fracture. LUNGS: Partially visualized opacity within the right lung base suspicious for pneumonia. IMPRESSION: 1. Enteric tube in place with distal tip and side port terminating within the expected location of the gastric body. 2. Small bowel obstruction, similar to the prior study. 3. Partially visualized opacity within the right lung base suspicious for pneumonia. Electronically signed by: Oneil Devonshire MD 12/31/2024 10:58 PM EST RP Workstation: [id number]   DG Abd Portable 1V Result Date: 12/30/2024 EXAM: 1 VIEW XRAY OF THE ABDOMEN 12/30/2024 05:10:00 AM COMPARISON: 12/28/2024 CLINICAL HISTORY: SBO (small bowel obstruction) (HCC) FINDINGS: LINES, TUBES AND DEVICES: Enteric tube in place, terminating within the gastric fundus. BOWEL: Nonobstructive bowel gas pattern. Interval decreased gaseous distention of the small bowel, now 3.9 cm in maximum diameter (previously 5.6 cm). Increasing colonic gas up to the rectum. Capsule endoscope now projects over the right lower quadrant of the abdomen possibly within distal small bowel or cecum. Previously, the capsule endoscope projected over the midline pelvis. SOFT TISSUES: No abnormal calcifications. BONES: No acute fracture. IMPRESSION: 1. Interval decreased gaseous distention of the small bowel,  now 3.9 cm in maximum diameter (previously 5.6 cm). 2. Increasing colonic gas up to the rectum. 3. Capsule endoscope now projects over the right lower quadrant of the abdomen, possibly within distal small bowel or cecum, previously over the midline pelvis. Electronically signed by: Waddell Calk MD 12/30/2024 06:45 AM EST RP Workstation: [id number]   DG Abd Portable 1V-Small Bowel Obstruction Protocol-initial, 8 hr delay Result Date: 12/28/2024 EXAM: 1 VIEW XRAY OF THE ABDOMEN 12/28/2024 11:15:33 PM COMPARISON: 12/28/2024 CLINICAL HISTORY: FINDINGS: LINES, TUBES AND DEVICES: Endoscopic capsule in midline pelvis. Enteric tube in gastric fundus. BOWEL: Dilated gas-filled small bowel loops throughout the abdomen, not appreciably changed. No contrast is noted within the colon. SOFT TISSUES: Contrast is seen within the bladder from a prior CT examination. No abnormal calcifications. BONES: No acute fracture. IMPRESSION: 1. Dilated gas-filled small bowel loops throughout the abdomen, not appreciably changed. 2. No contrast is noted within the colon. 3. Endoscopic capsule projects over the midline pelvis. Electronically signed by: Oneil Devonshire MD 12/28/2024 11:26 PM EST RP Workstation: [id number] Portable 1V-Small Bowel Protocol-Position Verification Result Date: 12/28/2024 CLINICAL DATA:  NG placement. EXAM: PORTABLE ABDOMEN - 1 VIEW COMPARISON:  Abdominal CT dated 12/27/2024. FINDINGS: Enteric tube  with tip and side-port in the proximal stomach. Diffusely dilated air-filled loops of small bowel measure up to 4 cm. IMPRESSION: Enteric tube with tip and side-port in the proximal stomach. Electronically Signed   By: Vanetta Chou M.D.   On: 12/28/2024 15:40   CT ABDOMEN PELVIS W CONTRAST Result Date: 12/27/2024 EXAM: CT ABDOMEN AND PELVIS WITH CONTRAST 12/27/2024 05:20:58 PM TECHNIQUE: CT of the abdomen and pelvis was performed with the administration of 100 mL of iohexol  (OMNIPAQUE ) 300 MG/ML solution.  Multiplanar reformatted images are provided for review. Automated exposure control, iterative reconstruction, and/or weight-based adjustment of the mA/kV was utilized to reduce the radiation dose to as low as reasonably achievable. COMPARISON: 10/29/2024 CLINICAL HISTORY: Abdominal pain, acute, nonlocalized. FINDINGS: LOWER CHEST: No acute abnormality. LIVER: The liver is unremarkable. GALLBLADDER AND BILE DUCTS: Gallbladder is unremarkable. No biliary ductal dilatation. SPLEEN: No acute abnormality. PANCREAS: No acute abnormality. ADRENAL GLANDS: No acute abnormality. KIDNEYS, URETERS AND BLADDER: No stones in the kidneys or ureters. No hydronephrosis. No perinephric or periureteral stranding. Urinary bladder is unremarkable. GI AND BOWEL: The stomach is decompressed with a small air fluid level present. Abnormal fluid filled distention of multiple segments of small bowel predominantly in the left abdomen measuring up to 4.2 cm in the pelvis. Abrupt narrowing in a segment of distal ileum in the right lower quadrant, where there is moderate wall thickening and mucosal enhancement, likely stricturing measuring 8.7 cm in length (axial 63-67). There is a likely endoscopic camera just downstream. Just a little bit further downstream in the ileum, there is a second region of wall thickening and mucosal enhancement with apparent narrowing measuring 5.1 cm in length (axial 58). Decompressed appendix with a small appendicolith in place. Descending and sigmoid colonic diverticulosis. No changes of acute diverticulitis. PERITONEUM AND RETROPERITONEUM: Small volume ascites, likely reactive. No free air. VASCULATURE: Aorta is normal in caliber. Diffuse aortoiliac atherosclerosis. LYMPH NODES: Couple of prominent ileocolic chain lymph nodes measuring up to 7 mm, likely reactive. REPRODUCTIVE ORGANS: Mild prostatomegaly. 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 No acute osseous abnormality.  No focal soft tissue abnormality. IMPRESSION: 1. Punctate versus early high-grade small bowel obstruction due to multisegment wall thickening in the distal ileum in the right lower quadrant, possibly stricturing, suggestive of an inflammatory ileitis, such as Crohn's disease. 2. Of note, the endoscopic capsule is positioned between two thickened segments of small bowel in the ileum. Continued radiographic follow-up is recommended to document passage. 3. Small volume ascites and interloop fluid, likely reactive. 4. Descending and sigmoid colonic diverticulosis. 5. Mild prostatomegaly. Electronically signed by: Rogelia Myers MD MD 12/27/2024 06:24 PM EST RP Workstation: [id number]   (Echo, Carotid, EGD, Colonoscopy, ERCP)    Subjective: Resting in bed feels better than yesterday no new complaints denies shortness of breath eyes nausea vomiting or diarrhea  Discharge Exam: Vitals:   01/08/25 0900 01/08/25 0900  BP: (!) 150/63 (!) 150/63  Pulse:  87  Resp:    Temp:    SpO2:     Vitals:   01/07/25 2124 01/08/25 0526 01/08/25 0900 01/08/25 0900  BP: (!) 146/50 (!) 137/59 (!) 150/63 (!) 150/63  Pulse: 74 76  87  Resp:  17    Temp:  (!) 97.4 F (36.3 C)    TempSrc:  Oral    SpO2:  98%    Weight:      Height:        General: Pt is alert, awake,  not in acute distress Cardiovascular: RRR, S1/S2 +, no rubs, no gallops Respiratory: CTA bilaterally, no wheezing, no rhonchi Abdominal: Soft, NT, ND, bowel sounds + Extremities: no edema, no cyanosis    The results of significant diagnostics from this hospitalization (including imaging, microbiology, ancillary and laboratory) are listed below for reference.     Microbiology: No results found for this or any previous visit (from the past 240 hours).   Labs: BNP (last 3 results) No results for input(s): BNP in the last 8760 hours. Basic Metabolic Panel: Recent Labs  Lab 01/02/25 0430 01/03/25 0405 01/04/25 0413 01/05/25 0427  01/06/25 0431 01/07/25 0438  NA 151* 145 146* 143 143 140  K 4.3 3.9 4.0 4.0 4.1 3.7  CL 118* 113* 113* 113* 115* 111  CO2 23 23 22  20* 18* 18*  GLUCOSE 177* 162* 147* 162* 172* 143*  BUN 29* 29* 28* 27* 26* 26*  CREATININE 1.38* 1.28* 1.24 1.21 1.30* 1.33*  CALCIUM 8.4* 8.3* 8.7* 8.6* 8.5* 8.3*  MG 2.7*  --  3.0* 2.7*  --   --    Liver Function Tests: Recent Labs  Lab 01/03/25 0405 01/07/25 0438  AST 24 21  ALT 14 17  ALKPHOS 90 98  BILITOT 0.4 0.4  PROT 5.8* 5.6*  ALBUMIN  3.2* 2.9*   No results for input(s): LIPASE, AMYLASE in the last 168 hours. No results for input(s): AMMONIA in the last 168 hours. CBC: Recent Labs  Lab 01/02/25 0430 01/03/25 0405 01/04/25 0413 01/05/25 0427 01/06/25 0431 01/07/25 0438 01/08/25 0700  WBC 8.3 9.7 9.7 10.6* 11.8* 16.9* 13.1*  NEUTROABS 6.7 7.8*  --   --   --   --   --   HGB 9.2* 9.6* 9.6* 10.0* 9.9* 9.5* 9.0*  HCT 28.5* 29.8* 29.6* 31.7* 30.2* 29.7* 27.9*  MCV 104.8* 104.6* 103.1* 104.6* 103.1* 104.2* 103.3*  PLT 105* 109* 112* 125* 127* 128* 126*   Cardiac Enzymes: No results for input(s): CKTOTAL, CKMB, CKMBINDEX, TROPONINI in the last 168 hours. BNP: Invalid input(s): POCBNP CBG: Recent Labs  Lab 01/02/25 0103  GLUCAP 154*   D-Dimer No results for input(s): DDIMER in the last 72 hours. Hgb A1c No results for input(s): HGBA1C in the last 72 hours. Lipid Profile No results for input(s): CHOL, HDL, LDLCALC, TRIG, CHOLHDL, LDLDIRECT in the last 72 hours. Thyroid function studies No results for input(s): TSH, T4TOTAL, T3FREE, THYROIDAB in the last 72 hours.  Invalid input(s): FREET3 Anemia work up No results for input(s): VITAMINB12, FOLATE, FERRITIN, TIBC, IRON, RETICCTPCT in the last 72 hours. Urinalysis    Component Value Date/Time   COLORURINE YELLOW 12/27/2024 1430   APPEARANCEUR CLEAR 12/27/2024 1430   LABSPEC 1.018 12/27/2024 1430   PHURINE 5.5  12/27/2024 1430   GLUCOSEU NEGATIVE 12/27/2024 1430   HGBUR NEGATIVE 12/27/2024 1430   BILIRUBINUR NEGATIVE 12/27/2024 1430   KETONESUR NEGATIVE 12/27/2024 1430   PROTEINUR TRACE (A) 12/27/2024 1430   NITRITE NEGATIVE 12/27/2024 1430   LEUKOCYTESUR NEGATIVE 12/27/2024 1430   Sepsis Labs Recent Labs  Lab 01/05/25 0427 01/06/25 0431 01/07/25 0438 01/08/25 0700  WBC 10.6* 11.8* 16.9* 13.1*   Microbiology No results found for this or any previous visit (from the past 240 hours).   Time coordinating discharge: 40 min SIGNED:   Almarie KANDICE Hoots, MD  [facility] 01/08/2025, 9:52 AM     [1]  Allergies Allergen Reactions   Novocain [Procaine] Hypertension   Sulfa Antibiotics Other (See Comments)    shaky  Protonix  [Pantoprazole ] Other (See Comments)    Reports dizziness    Vardenafil Hcl Palpitations

## 2025-01-08 NOTE — Progress Notes (Signed)
 Patient ID: Timothy Peterson, male   DOB: 07/22/1946, 79 y.o.   MRN: [id number]   Acute Care Surgery Service Progress Note:    Chief Complaint/Subjective: No n/v Tolerating solids. Eating breakfast now Stated he has been coughing up some stuff but it is clear No choking with eating  Objective: Vital signs in last 24 hours: Temp:  [97.4 F (36.3 C)-98.2 F (36.8 C)] 97.4 F (36.3 C) (01/24 0526) Pulse Rate:  [69-76] 76 (01/24 0526) Resp:  [17] 17 (01/24 0526) BP: (130-146)/(48-59) 137/59 (01/24 0526) SpO2:  [98 %-99 %] 98 % (01/24 0526) Last BM Date : 01/07/25  Intake/Output from previous day: 01/23 0701 - 01/24 0700 In: 1010 [P.O.:960; IV Piggyback:50] Out: 250 [Urine:250] Intake/Output this shift: No intake/output data recorded.  Lungs: nonlabored  Cardiovascular: reg  Abd: soft, min TTP, incisions c/d/I, no cellulitis  Extremities: no edema, +SCDs  Neuro: alert, nonfocal  Lab Results: CBC  Recent Labs    01/07/25 0438 01/08/25 0700  WBC 16.9* 13.1*  HGB 9.5* 9.0*  HCT 29.7* 27.9*  PLT 128* 126*   BMET Recent Labs    01/06/25 0431 01/07/25 0438  NA 143 140  K 4.1 3.7  CL 115* 111  CO2 18* 18*  GLUCOSE 172* 143*  BUN 26* 26*  CREATININE 1.30* 1.33*  CALCIUM 8.5* 8.3*   LFT    Latest Ref Rng & Units 01/07/2025    4:38 AM 01/03/2025    4:05 AM 12/28/2024    1:05 AM  Hepatic Function  Total Protein 6.5 - 8.1 g/dL 5.6  5.8  6.3   Albumin  3.5 - 5.0 g/dL 2.9  3.2  4.0   AST 15 - 41 U/L 21  24  18    ALT 0 - 44 U/L 17  14  13    Alk Phosphatase 38 - 126 U/L 98  90  103   Total Bilirubin 0.0 - 1.2 mg/dL 0.4  0.4  0.8    PT/INR No results for input(s): LABPROT, INR in the last 72 hours. ABG No results for input(s): PHART, HCO3 in the last 72 hours.  Invalid input(s): PCO2, PO2  Studies/Results:  Anti-infectives: Anti-infectives (From admission, onward)    Start     Dose/Rate Route Frequency Ordered Stop   12/30/24 2000   cefoTEtan  (CEFOTAN ) 2 g in sodium chloride  0.9 % 100 mL IVPB        2 g 200 mL/hr over 30 Minutes Intravenous Every 12 hours 12/30/24 1149 12/30/24 2159   12/30/24 0600  cefoTEtan  (CEFOTAN ) 2 g in sodium chloride  0.9 % 100 mL IVPB        2 g 200 mL/hr over 30 Minutes Intravenous On call to O.R. 12/29/24 1338 12/30/24 0805       Medications: Scheduled Meds:  acetaminophen   1,000 mg Oral Q6H   amLODipine   5 mg Oral Daily   apixaban   5 mg Oral BID   feeding supplement  237 mL Oral BID BM   Continuous Infusions: PRN Meds:.albuterol , alum & mag hydroxide-simeth, guaiFENesin -dextromethorphan , HYDROmorphone  (DILAUDID ) injection, ondansetron  (ZOFRAN ) IV, oxyCODONE , phenol  Assessment/Plan: Patient Active Problem List   Diagnosis Date Noted   Stricture of small intestine (HCC) 12/30/2024   Foreign body in small intestine 12/29/2024   Small bowel obstruction (HCC) 12/28/2024   Abdominal pain 12/27/2024   ARF (acute renal failure) 12/27/2024   Essential hypertension 12/27/2024   HLD (hyperlipidemia) 12/27/2024   Atrial flutter (HCC) 12/27/2024   Chronic anticoagulation 10/29/2024   Thrombocytopenia  10/29/2024   Abnormal finding on GI tract imaging 10/29/2024   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 Sheldon, 1/15 for SBO  -Afebrile. -WBC 11.8-->16.9-->13.1; HGB 9.5 from 9.9 -NGT fell out 1/19. -Regular diet.  Reports BM and flatulence. Pain manageable and is mostly a soreness. No nausea/vomiting.  -Pathology with active chronic ileitis with transmural inflammation, fissuring ulcers, pyloric gland metaplasia and strictures. Margins apear viable and uninvolved. Benign unremarkable appendix. Benign reactive lymph nodes. Negative for dysplasia or malignancy. Consistent with Crohn's possible Crohn's. [facility] aware.  -Continue to mobilize as  tolerated. -Continue multi-modal pain control -If tolerates regular diet, stable from general surgery standpoint. Discussed post-operative expectations and restrictions in great detail. Patient knows to call to confirm his follow up visit with GI and general surgery. Precautions provided.   FEN - regular/IVFs per primary team VTE - SCDs, Eliquis  ID - None currently needed    LOS: 11 days    I reviewed specialist notes, hospitalist notes, last 24 h vitals and pain scores, last 48 h intake and output, last 24 h labs and trends, and last 24 h imaging results.  Disposition: no fever, wbc trending down, I think pt is ok for dc from general surgery standpoint, discussed dc instructions with pt this am. Instructions and f/u on chart.   LOS: 12 days    Camellia HERO. Tanda, MD, FACS General, Bariatric, & Minimally Invasive Surgery [phone] [facility] Surgery, [facility]

## 2025-01-08 NOTE — TOC Transition Note (Signed)
 Transition of Care [facility]) - Discharge Note   Patient Details  Name: Timothy Peterson MRN: [id number] Date of Birth: 13-Sep-1946  Transition of Care [facility]) CM/SW Contact:  Sonda Manuella Quill, RN Phone Number: 01/08/2025, 9:47 AM   Clinical Narrative:    Orders received for HHPT; spoke w/ pt in room; pt declined having this RN CM set up these services; pt said he will set up these services w/ the [facility]; no IP CM needs.   Final next level of care: Home/Self Care Barriers to Discharge: No Barriers Identified   Patient Goals and CMS Choice Patient states their goals for this hospitalization and ongoing recovery are:: Home CMS Medicare.gov Compare Post Acute Care list provided to:: Patient Choice offered to / list presented to : Patient [place] ownership interest in [facility].provided to:: Patient    Discharge Placement                       Discharge Plan and Services Additional resources added to the After Visit Summary for   In-house Referral: NA Discharge Planning Services: CM Consult            DME Arranged: N/A DME Agency: NA       HH Arranged: NA HH Agency: NA        Social Drivers of Health (SDOH) Interventions SDOH Screenings   Food Insecurity: No Food Insecurity (12/27/2024)  Housing: Low Risk (12/27/2024)  Transportation Needs: No Transportation Needs (12/27/2024)  Utilities: Not At Risk (12/27/2024)  Social Connections: Moderately Integrated (12/27/2024)  Tobacco Use: Low Risk (12/30/2024)     Readmission Risk Interventions    12/28/2024   11:52 AM  Readmission Risk Prevention Plan  Post Dischage Appt Complete  Medication Screening Complete  Transportation Screening Complete

## 2025-01-13 ENCOUNTER — Telehealth: Payer: Self-pay | Admitting: Gastroenterology

## 2025-01-13 NOTE — Telephone Encounter (Signed)
 The pt was released from the hospital on Saturday and he has questions on his at home meds prescribed by [facility]CP[facility]/Cardiologist.  I have advised that he call the [place] and discuss what medications he should or should not take.  The pt has been advised of the information and verbalized understanding.

## 2025-01-13 NOTE — Telephone Encounter (Signed)
 Incoming call from pt regarding recent hospital visit . Pt stated he has questions about mediation he was prescribed and what should he take. Pt scheduled for appt on 03/06 @2 :50p  Please advise. Thank you

## 2025-01-17 NOTE — Progress Notes (Addendum)
 Video capsule endoscopy report Incomplete VCE with retention in the small bowel Extensive inflammatory changes starting at approximately 3 hours distal to first duodenal image There are patchy areas of normal-appearing mucosa. Concerning for underlying Crohn's disease. KUB to be recommended normally.  However, patient admitted to hospital with small bowel obstruction as a result of capsule leading to further hospitalization and ileocecectomy.  Formal video capsule report to be placed and scanned into the chart.  Aloha Finner, MD  Gastroenterology Advanced Endoscopy Office # [phone]

## 2025-02-18 ENCOUNTER — Ambulatory Visit: Admitting: Gastroenterology
